# Patient Record
Sex: Female | Born: 1985 | Race: White | Hispanic: No | Marital: Married | Smoking: Never smoker
Health system: Southern US, Community
[De-identification: ages and names within clinical notes are randomized; demographics above are authoritative.]

## PROBLEM LIST (undated history)

## (undated) DIAGNOSIS — Z789 Other specified health status: Secondary | ICD-10-CM

## (undated) DIAGNOSIS — D649 Anemia, unspecified: Secondary | ICD-10-CM

## (undated) DIAGNOSIS — R87629 Unspecified abnormal cytological findings in specimens from vagina: Secondary | ICD-10-CM

## (undated) HISTORY — DX: Anemia, unspecified: D64.9

## (undated) HISTORY — PX: WISDOM TOOTH EXTRACTION: SHX21

## (undated) HISTORY — DX: Unspecified abnormal cytological findings in specimens from vagina: R87.629

---

## 2005-08-05 ENCOUNTER — Ambulatory Visit: Payer: Self-pay | Admitting: Internal Medicine

## 2008-04-09 LAB — CONVERTED CEMR LAB: Pap Smear: NORMAL

## 2009-01-12 ENCOUNTER — Ambulatory Visit: Payer: Self-pay | Admitting: Family Medicine

## 2009-01-12 ENCOUNTER — Ambulatory Visit: Payer: Self-pay | Admitting: Cardiology

## 2009-01-12 DIAGNOSIS — R1031 Right lower quadrant pain: Secondary | ICD-10-CM

## 2009-01-12 LAB — CONVERTED CEMR LAB
Basophils Absolute: 0 10*3/uL (ref 0.0–0.1)
Basophils Relative: 0.1 % (ref 0.0–3.0)
Blood in Urine, dipstick: NEGATIVE
Eosinophils Relative: 1 % (ref 0.0–5.0)
Glucose, Urine, Semiquant: NEGATIVE
HCT: 35.7 % — ABNORMAL LOW (ref 36.0–46.0)
Hemoglobin: 12.2 g/dL (ref 12.0–15.0)
Lymphocytes Relative: 42.8 % (ref 12.0–46.0)
Monocytes Relative: 7.4 % (ref 3.0–12.0)
Neutro Abs: 2.4 10*3/uL (ref 1.4–7.7)
Nitrite: NEGATIVE
RBC: 3.97 M/uL (ref 3.87–5.11)
WBC Urine, dipstick: NEGATIVE
WBC: 5.1 10*3/uL (ref 4.5–10.5)

## 2010-10-14 IMAGING — CT CT PELVIS W/ CM
2 of 4 series · 16 of 46 positions shown, 18 images · IV contrast (Omnipaque 300)
Comparison: None

CT ABDOMEN

CLINICAL DATA: Right lower quadrant abdominal pain and diarrhea.

CT ABDOMEN AND PELVIS WITH CONTRAST
TECHNIQUE: Multidetector CT imaging of the abdomen and pelvis was
performed using the standard protocol following bolus
administration of intravenous contrast.
Contrast: 100 ml Jmnipaque-B[facility] IV

[Series 2: abd/ pel · axial · 0.75mm/px · z∈[-509,-119]mm · 13 of 89 slices shown, 15 images]
[im 7/89  soft-tissue]
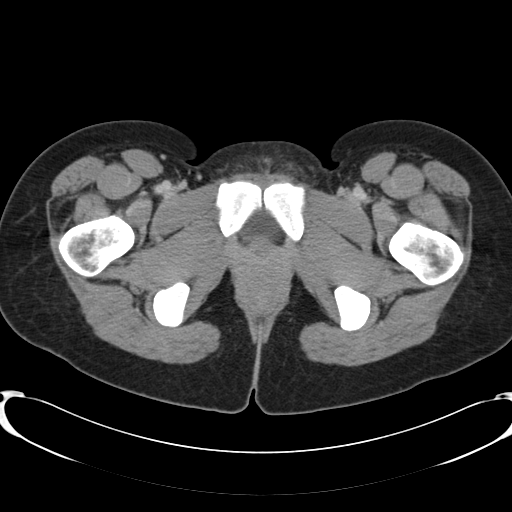
[im 7/89  bone]
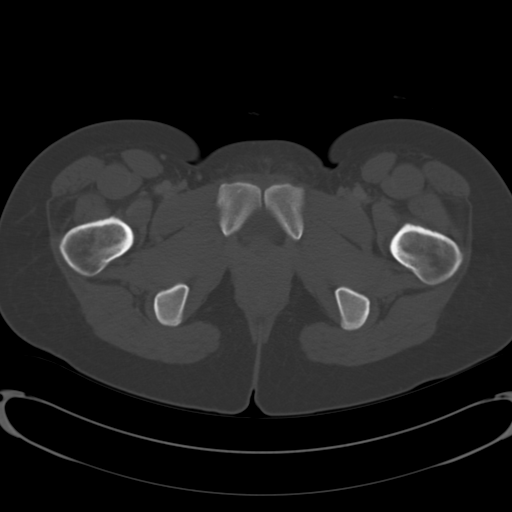
[im 14/89  soft-tissue]
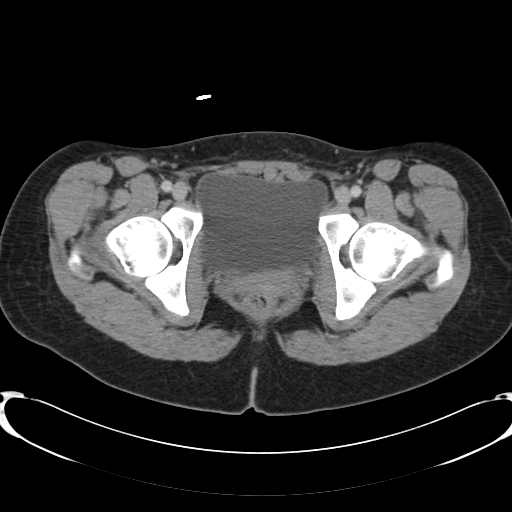
[im 20/89  soft-tissue]
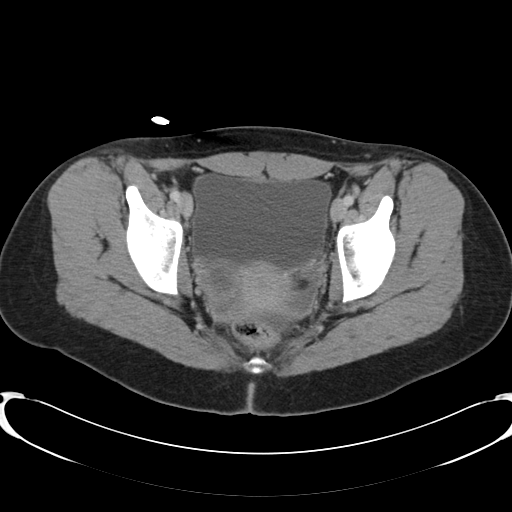
[im 27/89  soft-tissue]
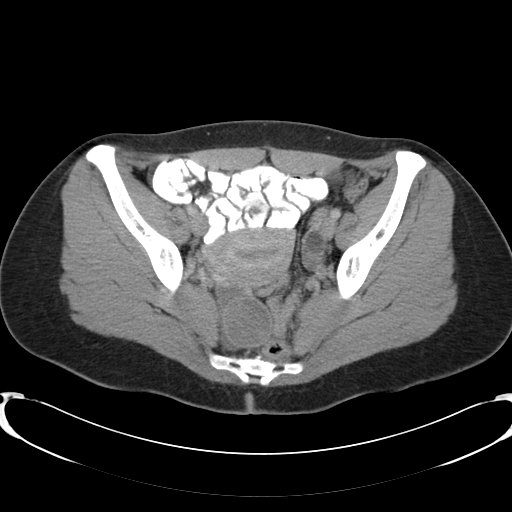
[im 33/89  soft-tissue]
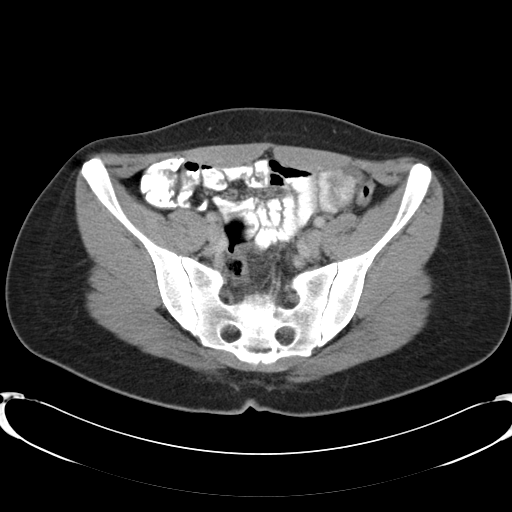
[im 40/89  soft-tissue]
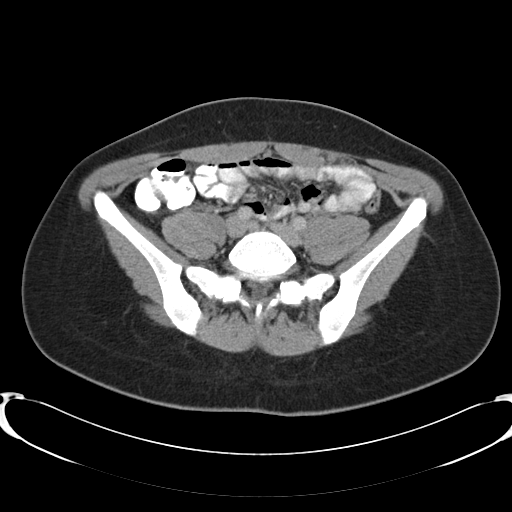
[im 46/89  soft-tissue]
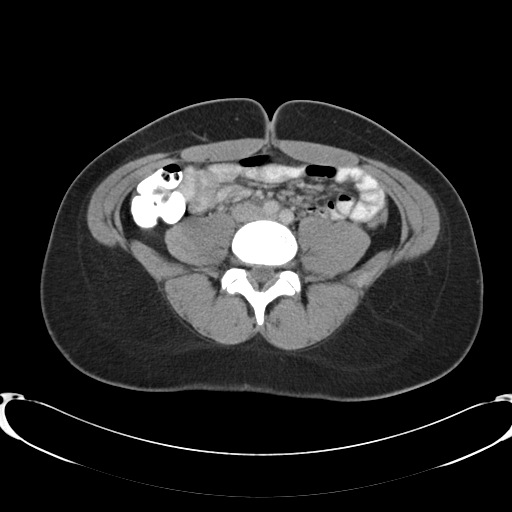
[im 53/89  soft-tissue]
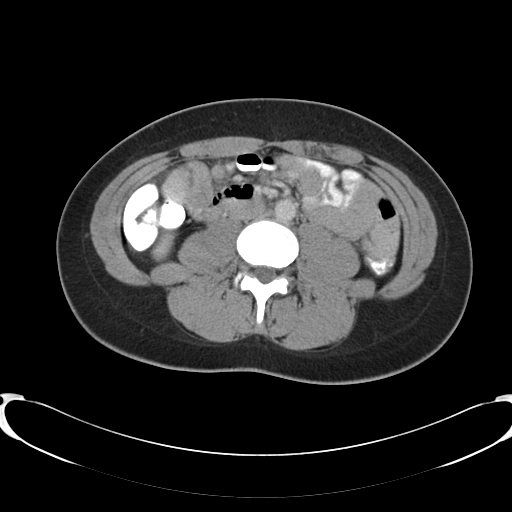
[im 59/89  soft-tissue]
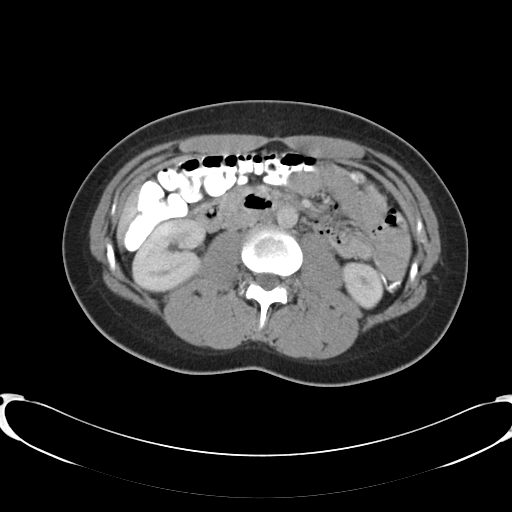
[im 59/89  bone]
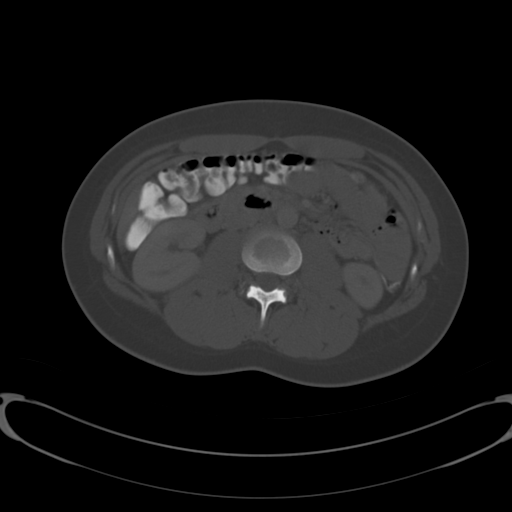
[im 66/89  soft-tissue]
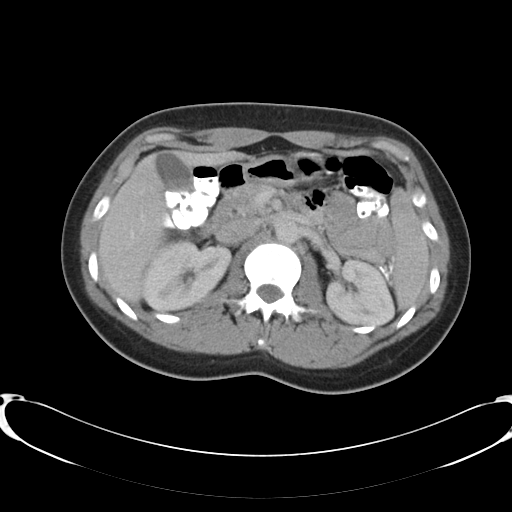
[im 72/89  soft-tissue]
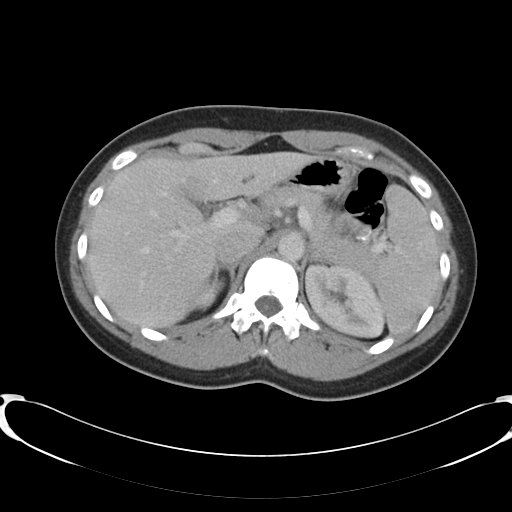
[im 79/89  soft-tissue]
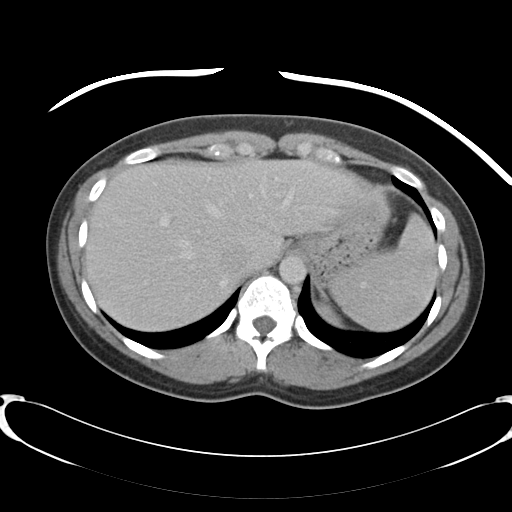
[im 85/89  soft-tissue]
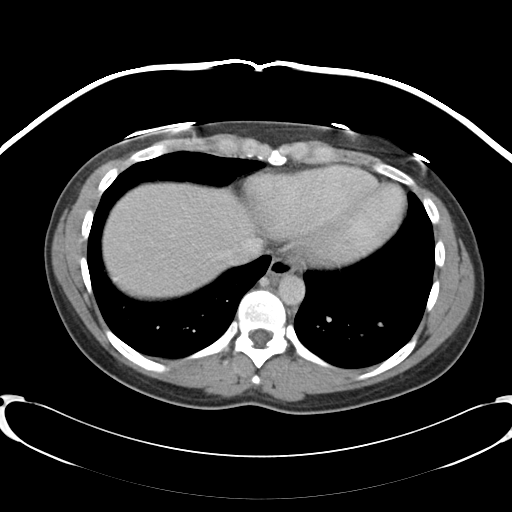

[Series 602: <mpr range> · coronal · 0.90mm/px · 3 of 102 slices shown]
[im 34/102  soft-tissue]
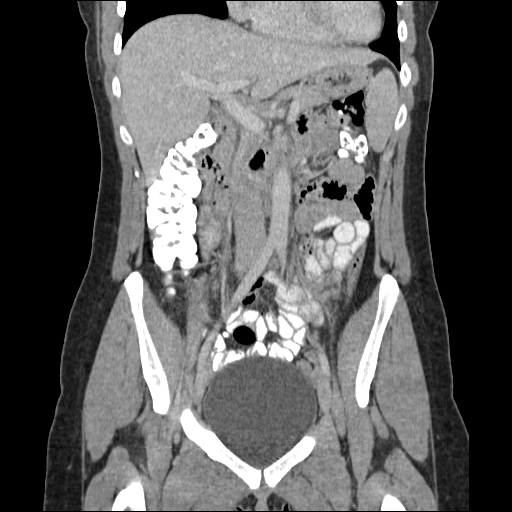
[im 45/102  soft-tissue]
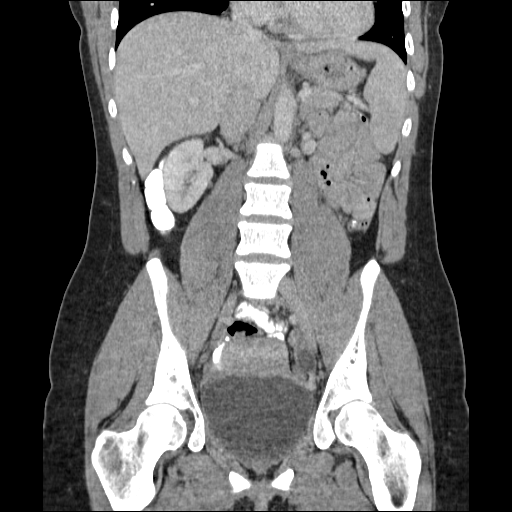
[im 57/102  soft-tissue]
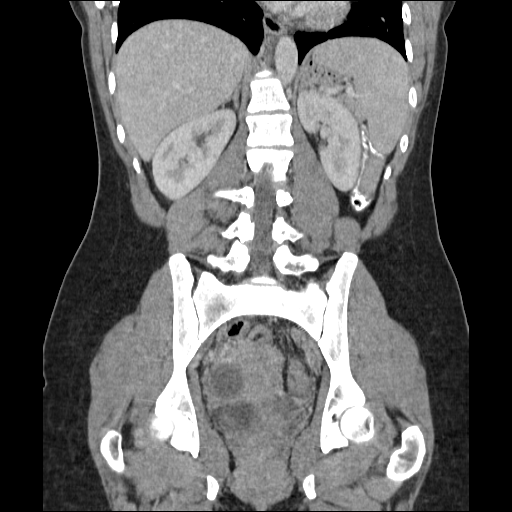

[16 of 46 positions shown; findings below may reference images not displayed]

FINDINGS: The liver, gallbladder, spleen, pancreas, adrenal glands
and kidneys are normal by CT.  There is no evidence of bowel
obstruction or acute inflammation inflammatory process in the
abdomen.  No focal fluid collections are identified.  No evidence
of enlarged lymph nodes or hernias.  No abnormal calcifications.
IMPRESSION: Normal CT of abdomen.

CT PELVIS
FINDINGS: There is adequate contrast material in the distal small
bowel and proximal colon.  Contrast is also visualized and a normal
caliber appendix.  There is no evidence of appendicitis by CT.

Abnormal cystic structures are seen in the right posterior pelvis
adjacent to, and posterior to, the uterus.  Complex cystic
abnormality measuring 3.5 x 3.8 cm demonstrates internal
hemorrhagic / complex fluid density.  Adjacent irregular cystic
area measures 2.3 cm and anterior adjacent more simple cystic area
measures 3.6 cm.  These are all likely emanating from the right
ovary / adnexa.  Given complex fluid density in the largest
abnormality, follow-up with pelvic ultrasound is recommended.
There is some associated free fluid in the cul-de-sac and posterior
pelvis potentially implicating recent cyst rupture as etiology of
the patient's pain.

Small follicles are present in the left ovary which have a normal
appearance.  A small amount of free fluid is also present
anteriorly in the pelvis on both right and left sides of the
midline.  The bladder is unremarkable.  No hernias are identified.
No abnormal calcifications.
IMPRESSION: Cystic structures of the right pelvis likely related to adnexal
cysts.  One of these measures 3.8 cm and demonstrates internal
increased density likely consistent with hemorrhagic / complex
fluid.  Irregular cyst is also present with adjacent free fluid
potentially indicating recent cyst rupture.  Given size of cystic
abnormalities, follow-up ultrasound is recommended.

## 2014-12-07 ENCOUNTER — Ambulatory Visit (INDEPENDENT_AMBULATORY_CARE_PROVIDER_SITE_OTHER): Payer: 59 | Admitting: Physician Assistant

## 2014-12-07 VITALS — BP 100/68 | HR 86 | Temp 98.3°F | Resp 16 | Ht 67.75 in | Wt 163.0 lb

## 2014-12-07 DIAGNOSIS — H6122 Impacted cerumen, left ear: Secondary | ICD-10-CM | POA: Diagnosis not present

## 2014-12-07 NOTE — Progress Notes (Signed)
[facility] [address] [place] [address] [phone]  Date:  12/07/2014   Name:  Kayla Trevino   DOB:  [birth year]   MRN:  [id number]  PCP:  No PCP Per Patient    Chief Complaint: Ear Fullness   History of Present Illness:  This is a 29 y.o. female who is presenting with ear fullness in left ear x 1 week. Feels her hearing is muffled. Noticed the ear fullness after going swimming 1 week ago. She denies drainage or pain. She has never needed ear cleaned out for cerumen build but states she has been told before she had a lot of wax in her left ear. She has tried putting mineral oil and alcohol in ear to break up wax but hasn't helped.  Review of Systems:  Review of Systems See HPI  Patient Active Problem List   Diagnosis Date Noted  . RLQ PAIN 01/12/2009    Prior to Admission medications   Medication Sig Start Date End Date Taking? Authorizing Provider  norethindrone-ethinyl estradiol (MICROGESTIN,JUNEL,LOESTRIN) 1-20 MG-MCG tablet Take 1 tablet by mouth daily.   Yes Historical Provider, MD    No Known Allergies  History reviewed. No pertinent past surgical history.  History  Substance Use Topics  . Smoking status: Never Smoker   . Smokeless tobacco: Not on file  . Alcohol Use: 0.0 oz/week    0 Standard drinks or equivalent per week    Family History  Problem Relation Age of Onset  . Diabetes Father     Medication list has been reviewed and updated.  Physical Examination:  Physical Exam  Constitutional: She is oriented to person, place, and time. She appears well-developed and well-nourished. No distress.  HENT:  Head: Normocephalic and atraumatic.  Right Ear: Hearing, tympanic membrane, external ear and ear canal normal.  Left Ear: Hearing, external ear and ear canal normal.  Nose: Nose normal.  Left TM blocked by cerumen impaction After lavage, cerumen removed and TM clear  Eyes: Conjunctivae and lids are normal. Right eye  exhibits no discharge. Left eye exhibits no discharge. No scleral icterus.  Pulmonary/Chest: Effort normal. No respiratory distress.  Musculoskeletal: Normal range of motion.  Neurological: She is alert and oriented to person, place, and time.  Skin: Skin is warm, dry and intact. No lesion and no rash noted.  Psychiatric: She has a normal mood and affect. Her speech is normal and behavior is normal. Thought content normal.   BP 100/68 mmHg  Pulse 86  Temp(Src) 98.3 F (36.8 C) (Oral)  Resp 16  Ht 5' 7.75" (1.721 m)  Wt 163 lb (73.936 kg)  BMI 24.96 kg/m2  SpO2 96%  LMP 11/23/2014  Assessment and Plan:  1. Cerumen impaction, left Cerumen removed, symptoms resolved. Discussed prevention. Return with further problems/concerns.   Benjaman Pott Drenda Freeze, MHS [facility]  12/07/2014

## 2015-07-04 MED FILL — LARIN 21 1-20 TABLET: 1-20 | 84 days supply | Qty: 63 | Fill #1

## 2015-08-28 DIAGNOSIS — H6502 Acute serous otitis media, left ear: Secondary | ICD-10-CM | POA: Diagnosis not present

## 2015-08-28 DIAGNOSIS — H8192 Unspecified disorder of vestibular function, left ear: Secondary | ICD-10-CM | POA: Diagnosis not present

## 2015-08-28 MED FILL — predniSONE 10 MG TABS: 10 | 5 days supply | Qty: 15 | Fill #0

## 2015-08-28 MED FILL — MECLIZINE 25 MG TABLET: 25 | 5 days supply | Qty: 15 | Fill #0

## 2015-08-31 DIAGNOSIS — R42 Dizziness and giddiness: Secondary | ICD-10-CM | POA: Diagnosis not present

## 2015-09-28 MED FILL — LARIN 21 1-20 TABLET: 1-20 | 84 days supply | Qty: 63 | Fill #2

## 2015-12-13 MED FILL — LARIN 21 1-20 TABLET: 1-20 | 84 days supply | Qty: 63 | Fill #3

## 2016-01-29 DIAGNOSIS — Z01419 Encounter for gynecological examination (general) (routine) without abnormal findings: Secondary | ICD-10-CM | POA: Diagnosis not present

## 2016-01-29 DIAGNOSIS — Z6826 Body mass index (BMI) 26.0-26.9, adult: Secondary | ICD-10-CM | POA: Diagnosis not present

## 2016-06-09 NOTE — L&D Delivery Note (Signed)
Delivery Note At 12:52 AM a viable female was delivered via Vaginal, Spontaneous (Presentation: DOA;  ).  APGAR: 2, 8; weight  .  pending Placenta status: , spont, intact.  Cord: 3vc with the following complications: tight double nuchal cord, clamped after delivery of head, shoulder followed easilty.  Cord pH: pending  Maternal temp to 101.3 immediately after delivery, no temp on baby. Will wtch x 1 hr, plan abx if mom still with temp. Baby with thick meconium and low 4min Apgar, neonatologist in to observe, suction, stimulate.   Anesthesia:  epidural Episiotomy: None Lacerations: 2nd degree;Sulcus Suture Repair: 2.0 3.0 vicryl rapide Est. Blood Loss (mL):  350  Mom to postpartum.  Baby to Couplet care / Skin to Skin.  Ala Dach 04/26/2017, 1:39 AM

## 2016-08-13 DIAGNOSIS — Z3201 Encounter for pregnancy test, result positive: Secondary | ICD-10-CM | POA: Diagnosis not present

## 2016-09-01 DIAGNOSIS — Z3201 Encounter for pregnancy test, result positive: Secondary | ICD-10-CM | POA: Diagnosis not present

## 2016-09-10 MED FILL — PRENATAL VITAMIN PLUS LOW I: 27-1 | 90 days supply | Qty: 90 | Fill #0

## 2016-10-07 DIAGNOSIS — Z3401 Encounter for supervision of normal first pregnancy, first trimester: Secondary | ICD-10-CM | POA: Diagnosis not present

## 2016-10-07 DIAGNOSIS — Z3689 Encounter for other specified antenatal screening: Secondary | ICD-10-CM | POA: Diagnosis not present

## 2016-10-07 DIAGNOSIS — Z3491 Encounter for supervision of normal pregnancy, unspecified, first trimester: Secondary | ICD-10-CM | POA: Diagnosis not present

## 2016-10-07 DIAGNOSIS — Z36 Encounter for antenatal screening for chromosomal anomalies: Secondary | ICD-10-CM | POA: Diagnosis not present

## 2016-10-20 DIAGNOSIS — Z3682 Encounter for antenatal screening for nuchal translucency: Secondary | ICD-10-CM | POA: Diagnosis not present

## 2016-11-12 DIAGNOSIS — Z3682 Encounter for antenatal screening for nuchal translucency: Secondary | ICD-10-CM | POA: Diagnosis not present

## 2016-11-12 DIAGNOSIS — Z361 Encounter for antenatal screening for raised alphafetoprotein level: Secondary | ICD-10-CM | POA: Diagnosis not present

## 2016-12-04 DIAGNOSIS — Z363 Encounter for antenatal screening for malformations: Secondary | ICD-10-CM | POA: Diagnosis not present

## 2016-12-04 DIAGNOSIS — Z3686 Encounter for antenatal screening for cervical length: Secondary | ICD-10-CM | POA: Diagnosis not present

## 2016-12-11 MED FILL — PRENATAL VITAMIN PLUS LOW I: 27-1 | 90 days supply | Qty: 90 | Fill #1

## 2017-01-28 DIAGNOSIS — Z3689 Encounter for other specified antenatal screening: Secondary | ICD-10-CM | POA: Diagnosis not present

## 2017-02-13 DIAGNOSIS — Z3689 Encounter for other specified antenatal screening: Secondary | ICD-10-CM | POA: Diagnosis not present

## 2017-02-13 DIAGNOSIS — Z23 Encounter for immunization: Secondary | ICD-10-CM | POA: Diagnosis not present

## 2017-03-04 MED FILL — hydrOXYzine HCL 25 MG TABS: 25 | 20 days supply | Qty: 20 | Fill #0

## 2017-03-16 MED FILL — PRENATAL VITAMIN PLUS LOW I: 27-1 | 90 days supply | Qty: 90 | Fill #2

## 2017-03-25 DIAGNOSIS — Z3685 Encounter for antenatal screening for Streptococcus B: Secondary | ICD-10-CM | POA: Diagnosis not present

## 2017-04-09 LAB — OB RESULTS CONSOLE GBS: GBS: NEGATIVE

## 2017-04-24 DIAGNOSIS — Z3A39 39 weeks gestation of pregnancy: Secondary | ICD-10-CM | POA: Diagnosis not present

## 2017-04-24 DIAGNOSIS — O3663X0 Maternal care for excessive fetal growth, third trimester, not applicable or unspecified: Secondary | ICD-10-CM | POA: Diagnosis not present

## 2017-04-25 ENCOUNTER — Encounter (HOSPITAL_COMMUNITY): Payer: Self-pay | Admitting: Anesthesiology

## 2017-04-25 ENCOUNTER — Inpatient Hospital Stay (HOSPITAL_COMMUNITY): Payer: 59 | Admitting: Anesthesiology

## 2017-04-25 ENCOUNTER — Encounter (HOSPITAL_COMMUNITY): Payer: Self-pay | Admitting: *Deleted

## 2017-04-25 ENCOUNTER — Inpatient Hospital Stay (HOSPITAL_COMMUNITY)
Admission: AD | Admit: 2017-04-25 | Discharge: 2017-04-27 | DRG: 805 | Disposition: A | Discharge status: Home / Self Care | Payer: 59 | Source: Ambulatory Visit | Attending: Obstetrics | Admitting: Obstetrics

## 2017-04-25 DIAGNOSIS — O411230 Chorioamnionitis, third trimester, not applicable or unspecified: Secondary | ICD-10-CM | POA: Diagnosis present

## 2017-04-25 DIAGNOSIS — Z3A39 39 weeks gestation of pregnancy: Secondary | ICD-10-CM | POA: Diagnosis not present

## 2017-04-25 DIAGNOSIS — O3663X0 Maternal care for excessive fetal growth, third trimester, not applicable or unspecified: Secondary | ICD-10-CM | POA: Diagnosis present

## 2017-04-25 DIAGNOSIS — Z3483 Encounter for supervision of other normal pregnancy, third trimester: Secondary | ICD-10-CM | POA: Diagnosis present

## 2017-04-25 HISTORY — DX: Other specified health status: Z78.9

## 2017-04-25 LAB — CBC
HEMATOCRIT: 38.5 % (ref 36.0–46.0)
HEMOGLOBIN: 13 g/dL (ref 12.0–15.0)
MCH: 32.2 pg (ref 26.0–34.0)
MCHC: 33.8 g/dL (ref 30.0–36.0)
MCV: 95.3 fL (ref 78.0–100.0)
Platelets: 173 10*3/uL (ref 150–400)
RBC: 4.04 MIL/uL (ref 3.87–5.11)
RDW: 12.9 % (ref 11.5–15.5)
WBC: 12 10*3/uL — ABNORMAL HIGH (ref 4.0–10.5)

## 2017-04-25 LAB — TYPE AND SCREEN
ABO/RH(D): O POS
Antibody Screen: NEGATIVE

## 2017-04-25 LAB — ABO/RH: ABO/RH(D): O POS

## 2017-04-25 MED ORDER — ACETAMINOPHEN 325 MG PO TABS: 650.0000 mg | ORAL_TABLET | ORAL | Status: DC | PRN

## 2017-04-25 MED ORDER — FENTANYL 2.5 MCG/ML BUPIVACAINE 1/10 % EPIDURAL INFUSION (WH - ANES)
14.0000 mL/h | INTRAMUSCULAR | Status: DC | PRN
  Administered 2017-04-25: 14 mL/h via EPIDURAL
  Filled 2017-04-25: qty 100

## 2017-04-25 MED ORDER — LIDOCAINE HCL (PF) 1 % IJ SOLN
30.0000 mL | INTRAMUSCULAR | Status: DC | PRN
  Filled 2017-04-25: qty 30

## 2017-04-25 MED ORDER — OXYCODONE-ACETAMINOPHEN 5-325 MG PO TABS: 1.0000 | ORAL_TABLET | ORAL | Status: DC | PRN

## 2017-04-25 MED ORDER — LACTATED RINGERS IV SOLN: 500.0000 mL | Freq: Once | INTRAVENOUS | Status: DC

## 2017-04-25 MED ORDER — OXYTOCIN 40 UNITS IN LACTATED RINGERS INFUSION - SIMPLE MED
1.0000 m[IU]/min | INTRAVENOUS | Status: DC
  Administered 2017-04-25: 2 m[IU]/min via INTRAVENOUS

## 2017-04-25 MED ORDER — LACTATED RINGERS IV SOLN
INTRAVENOUS | Status: DC
  Administered 2017-04-25 (×2): via INTRAVENOUS

## 2017-04-25 MED ORDER — FLEET ENEMA 7-19 GM/118ML RE ENEM: 1.0000 | ENEMA | RECTAL | Status: DC | PRN

## 2017-04-25 MED ORDER — PHENYLEPHRINE 40 MCG/ML (10ML) SYRINGE FOR IV PUSH (FOR BLOOD PRESSURE SUPPORT)
80.0000 ug | PREFILLED_SYRINGE | INTRAVENOUS | Status: DC | PRN
  Filled 2017-04-25: qty 5
  Filled 2017-04-25: qty 10

## 2017-04-25 MED ORDER — PHENYLEPHRINE 40 MCG/ML (10ML) SYRINGE FOR IV PUSH (FOR BLOOD PRESSURE SUPPORT)
80.0000 ug | PREFILLED_SYRINGE | INTRAVENOUS | Status: DC | PRN
  Filled 2017-04-25: qty 5

## 2017-04-25 MED ORDER — SOD CITRATE-CITRIC ACID 500-334 MG/5ML PO SOLN: 30.0000 mL | ORAL | Status: DC | PRN

## 2017-04-25 MED ORDER — OXYCODONE-ACETAMINOPHEN 5-325 MG PO TABS: 2.0000 | ORAL_TABLET | ORAL | Status: DC | PRN

## 2017-04-25 MED ORDER — LIDOCAINE HCL (PF) 1 % IJ SOLN
INTRAMUSCULAR | Status: DC | PRN
  Administered 2017-04-25: 4 mL

## 2017-04-25 MED ORDER — LACTATED RINGERS IV SOLN: 500.0000 mL | INTRAVENOUS | Status: DC | PRN

## 2017-04-25 MED ORDER — OXYTOCIN 40 UNITS IN LACTATED RINGERS INFUSION - SIMPLE MED
2.5000 [IU]/h | INTRAVENOUS | Status: DC
  Filled 2017-04-25: qty 1000

## 2017-04-25 MED ORDER — TERBUTALINE SULFATE 1 MG/ML IJ SOLN
0.2500 mg | Freq: Once | INTRAMUSCULAR | Status: DC | PRN
  Filled 2017-04-25: qty 1

## 2017-04-25 MED ORDER — EPHEDRINE 5 MG/ML INJ
10.0000 mg | INTRAVENOUS | Status: DC | PRN
  Filled 2017-04-25: qty 2

## 2017-04-25 MED ORDER — DIPHENHYDRAMINE HCL 50 MG/ML IJ SOLN: 12.5000 mg | INTRAMUSCULAR | Status: DC | PRN

## 2017-04-25 MED ORDER — ONDANSETRON HCL 4 MG/2ML IJ SOLN
4.0000 mg | Freq: Four times a day (QID) | INTRAMUSCULAR | Status: DC | PRN
  Administered 2017-04-25: 4 mg via INTRAVENOUS
  Filled 2017-04-25: qty 2

## 2017-04-25 MED ORDER — OXYTOCIN BOLUS FROM INFUSION
500.0000 mL | Freq: Once | INTRAVENOUS | Status: AC
  Administered 2017-04-26: 500 mL via INTRAVENOUS

## 2017-04-25 NOTE — Anesthesia Pain Management Evaluation Note (Signed)
  CRNA Pain Management Visit Note  Patient: Kayla Trevino, 31 y.o., female  "Hello I am a member of the anesthesia team at [facility]. We have an anesthesia team available at all times to provide care throughout the hospital, including epidural management and anesthesia for C-section. I don't know your plan for the delivery whether it a natural birth, water birth, IV sedation, nitrous supplementation, doula or epidural, but we want to meet your pain goals."   1.Was your pain managed to your expectations on prior hospitalizations?   No prior hospitalizations  2.What is your expectation for pain management during this hospitalization?     Labor support without medications and Epidural  3.How can we help you reach that goal? Possible epidural  Record the patient's initial score and the patient's pain goal.   Pain: 8  Pain Goal: 8 [facility] wants you to be able to say your pain was always managed very well.  Kieth Hartis 04/25/2017

## 2017-04-25 NOTE — Progress Notes (Signed)
S: Doing well, no complaints, pt has been doing well breathing through contractions, now wanting epidural.   O: BP 121/73   Pulse 91   Temp 99.2 F (37.3 C) (Oral)   Resp 20   Ht 5\' 7"  (1.702 m)   Wt 94.3 kg (208 lb)   BMI 32.58 kg/m    FHT:  FHR: 130s bpm, variability: moderate,  accelerations:  Present,  decelerations:  Absent UC:   regular, every 3 minutes SVE:   Dilation: 6.5 Effacement (%): 100 Station: 0 Exam by:: Dr. Pamala Hurry   A / P:  31 y.o.  Obstetric History   G1   P0   T0   P0   A0   L0    SAB0   TAB0   Ectopic0   Multiple0   Live Births0    at [redacted]w[redacted]d Protracted active phase  Epidural encourage so pt can rest IUPC now, may need pitocin.   Fetal Wellbeing:  Category I Pain Control:  move to epidural now  Anticipated MOD:  unclear, lack of descent and known LGA as risks for PCS  Ala Dach 04/25/2017, 9:37 PM

## 2017-04-25 NOTE — Anesthesia Preprocedure Evaluation (Signed)
Anesthesia Evaluation  Patient identified by MRN, date of birth, ID band Patient awake    Reviewed: Allergy & Precautions, Patient's Chart, lab work & pertinent test results  Airway Mallampati: II       Dental  (+) Teeth Intact   Pulmonary neg pulmonary ROS,    breath sounds clear to auscultation       Cardiovascular negative cardio ROS   Rhythm:Regular Rate:Normal     Neuro/Psych negative neurological ROS     GI/Hepatic negative GI ROS, Neg liver ROS,   Endo/Other  negative endocrine ROS  Renal/GU negative Renal ROS     Musculoskeletal negative musculoskeletal ROS (+)   Abdominal   Peds  Hematology negative hematology ROS (+)   Anesthesia Other Findings Day of surgery medications reviewed with the patient.  Reproductive/Obstetrics (+) Pregnancy                             Lab Results  Component Value Date   WBC 12.0 (H) 04/25/2017   HGB 13.0 04/25/2017   HCT 38.5 04/25/2017   MCV 95.3 04/25/2017   PLT 173 04/25/2017     Anesthesia Physical Anesthesia Plan  ASA: II  Anesthesia Plan: Epidural   Post-op Pain Management:    Induction:   PONV Risk Score and Plan:   Airway Management Planned:   Additional Equipment:   Intra-op Plan:   Post-operative Plan:   Informed Consent: I have reviewed the patients History and Physical, chart, labs and discussed the procedure including the risks, benefits and alternatives for the proposed anesthesia with the patient or authorized representative who has indicated his/her understanding and acceptance.     Plan Discussed with:   Anesthesia Plan Comments:         Anesthesia Secrist Evaluation

## 2017-04-25 NOTE — MAU Note (Signed)
Ctx since 2000- have progressively gotten stronger and more regular. Having bloody show, no LOF. +FM. No complications with preg.

## 2017-04-25 NOTE — H&P (Signed)
Kayla Trevino is a 31 y.o. G1P0 at [redacted]w[redacted]d presenting for active labor. Pt notes onset contractions yesterday at 8pm. Contracted through the night, poor sleep, presented to MAU around 3p, made small cervical change and admitted at 4cm.  Good fetal movement, No vaginal bleeding, not leaking fluid.  PNCare at [facility] since 6 wks - Dated by LMP c/w 6 wk  U/s - LGA. U./s at 39 wks- baby 4200grams, HC 34cm, AC 38cm, DS 81.   Prenatal Transfer Tool  Maternal Diabetes: No Genetic Screening: Normal Maternal Ultrasounds/Referrals: Normal Fetal Ultrasounds or other Referrals:  None Maternal Substance Abuse:  No Significant Maternal Medications:  None Significant Maternal Lab Results: None     OB History    Gravida Para Term Preterm AB Living   1             SAB TAB Ectopic Multiple Live Births                 Past Medical History:  Diagnosis Date  . Medical history non-contributory    Past Surgical History:  Procedure Laterality Date  . WISDOM TOOTH EXTRACTION     Family History: family history includes Diabetes in her father. Social History:  reports that  has never smoked. she has never used smokeless tobacco. She reports that she drinks alcohol. She reports that she does not use drugs.  Review of Systems - Negative except uncomfrtable contractions   Dilation: 4 Effacement (%): 100 Station: -2 Exam by:: n druebbisch rn Blood pressure 138/86, pulse 82, temperature 97.8 F (36.6 C), temperature source Oral, resp. rate 18, height 5\' 7"  (1.702 m), weight 94.3 kg (208 lb).  Physical Exam:  Gen: well appearing, no distress,. On birthing ball, uncomfortable with contractions  Abd: gravid, NT, no RUQ pain LE: trace edema, equal bilaterally, non-tender Toco: q5 ,min FH: baseline 130s, accelerations present, no deceleratons, 10 beat variability  Prenatal labs: ABO, Rh: --/--/O POS (11/17 1635) Antibody: NEG (11/17 1635) Rubella:  immune RPR:   NR HBsAg:  neg  HIV:    neg GBS: Negative (11/01 0000)  1 hr Glucola 81  Genetic screening nl NT, nl AFP Anatomy US nl   Assessment/Plan: 31 y.o. G1P0 at [redacted]w[redacted]d Active labor, pt asks for Augmentation. AROM now.  Planning epidural Reactive fetal testing LGA, watch descent closely.    Ala Dach 04/25/2017, 5:59 PM

## 2017-04-25 NOTE — Anesthesia Procedure Notes (Signed)
Epidural Patient location during procedure: OB Start time: 04/25/2017 9:47 PM End time: 04/25/2017 9:53 PM  Staffing Anesthesiologist: Effie Berkshire, MD Performed: anesthesiologist   Preanesthetic Checklist Completed: patient identified, site marked, surgical consent, pre-op evaluation, timeout performed, IV checked, risks and benefits discussed and monitors and equipment checked  Epidural Patient position: sitting Prep: ChloraPrep Patient monitoring: heart rate, continuous pulse ox and blood pressure Approach: midline Location: L3-L4 Injection technique: LOR saline  Needle:  Needle type: Tuohy  Needle gauge: 17 G Needle length: 9 cm Catheter type: closed end flexible Catheter size: 20 Guage Test dose: negative and 1.5% lidocaine  Assessment Events: blood not aspirated, injection not painful, no injection resistance and no paresthesia  Additional Notes LOR @ 5.5  Patient identified. Risks/Benefits/Options discussed with patient including but not limited to bleeding, infection, nerve damage, paralysis, failed block, incomplete pain control, headache, blood pressure changes, nausea, vomiting, reactions to medications, itching and postpartum back pain. Confirmed with bedside nurse the patient's most recent platelet count. Confirmed with patient that they are not currently taking any anticoagulation, have any bleeding history or any family history of bleeding disorders. Patient expressed understanding and wished to proceed. All questions were answered. Sterile technique was used throughout the entire procedure. Please see nursing notes for vital signs. Test dose was given through epidural catheter and negative prior to continuing to dose epidural or start infusion. Warning signs of high block given to the patient including shortness of breath, tingling/numbness in hands, complete motor block, or any concerning symptoms with instructions to call for help. Patient was given instructions  on fall risk and not to get out of bed. All questions and concerns addressed with instructions to call with any issues or inadequate analgesia.    Reason for block:procedure for pain

## 2017-04-26 LAB — RPR: RPR: NONREACTIVE

## 2017-04-26 MED ORDER — SIMETHICONE 80 MG PO CHEW: 80.0000 mg | CHEWABLE_TABLET | ORAL | Status: DC | PRN

## 2017-04-26 MED ORDER — COCONUT OIL OIL
1.0000 "application " | TOPICAL_OIL | Status: DC | PRN
  Administered 2017-04-26: 1 via TOPICAL
  Filled 2017-04-26: qty 120

## 2017-04-26 MED ORDER — IBUPROFEN 600 MG PO TABS
600.0000 mg | ORAL_TABLET | Freq: Four times a day (QID) | ORAL | Status: DC
  Administered 2017-04-26 – 2017-04-27 (×4): 600 mg via ORAL
  Filled 2017-04-26 (×4): qty 1

## 2017-04-26 MED ORDER — ACETAMINOPHEN 325 MG PO TABS: 650.0000 mg | ORAL_TABLET | ORAL | Status: DC | PRN

## 2017-04-26 MED ORDER — OXYCODONE HCL 5 MG PO TABS: 10.0000 mg | ORAL_TABLET | ORAL | Status: DC | PRN

## 2017-04-26 MED ORDER — GENTAMICIN SULFATE 40 MG/ML IJ SOLN: 7.0000 mg/kg | INTRAVENOUS | Status: DC

## 2017-04-26 MED ORDER — PRENATAL MULTIVITAMIN CH
1.0000 | ORAL_TABLET | Freq: Every day | ORAL | Status: DC
  Administered 2017-04-26: 1 via ORAL
  Filled 2017-04-26: qty 1

## 2017-04-26 MED ORDER — BENZOCAINE-MENTHOL 20-0.5 % EX AERO
1.0000 | INHALATION_SPRAY | CUTANEOUS | Status: DC | PRN
Start: 2017-04-26 — End: 2017-04-27
  Filled 2017-04-26: qty 56

## 2017-04-26 MED ORDER — OXYCODONE HCL 5 MG PO TABS: 5.0000 mg | ORAL_TABLET | ORAL | Status: DC | PRN

## 2017-04-26 MED ORDER — DIBUCAINE 1 % RE OINT: 1.0000 "application " | TOPICAL_OINTMENT | RECTAL | Status: DC | PRN

## 2017-04-26 MED ORDER — CLINDAMYCIN PHOSPHATE 900 MG/50ML IV SOLN
900.0000 mg | Freq: Three times a day (TID) | INTRAVENOUS | Status: DC
  Administered 2017-04-26: 900 mg via INTRAVENOUS
  Filled 2017-04-26 (×2): qty 50

## 2017-04-26 MED ORDER — ONDANSETRON HCL 4 MG/2ML IJ SOLN
4.0000 mg | INTRAMUSCULAR | Status: DC | PRN
Start: 2017-04-26 — End: 2017-04-27

## 2017-04-26 MED ORDER — TETANUS-DIPHTH-ACELL PERTUSSIS 5-2.5-18.5 LF-MCG/0.5 IM SUSP: 0.5000 mL | Freq: Once | INTRAMUSCULAR | Status: DC

## 2017-04-26 MED ORDER — ZOLPIDEM TARTRATE 5 MG PO TABS: 5.0000 mg | ORAL_TABLET | Freq: Every evening | ORAL | Status: DC | PRN

## 2017-04-26 MED ORDER — DIPHENHYDRAMINE HCL 25 MG PO CAPS: 25.0000 mg | ORAL_CAPSULE | Freq: Four times a day (QID) | ORAL | Status: DC | PRN

## 2017-04-26 MED ORDER — SENNOSIDES-DOCUSATE SODIUM 8.6-50 MG PO TABS
2.0000 | ORAL_TABLET | ORAL | Status: DC
  Administered 2017-04-27: 2 via ORAL
  Filled 2017-04-26: qty 2

## 2017-04-26 MED ORDER — GENTAMICIN SULFATE 40 MG/ML IJ SOLN
7.0000 mg/kg | INTRAVENOUS | Status: DC
  Administered 2017-04-26: 520 mg via INTRAVENOUS
  Filled 2017-04-26: qty 13

## 2017-04-26 MED ORDER — GENTAMICIN SULFATE 40 MG/ML IJ SOLN: 7.0000 mg/kg | Freq: Three times a day (TID) | INTRAVENOUS | Status: DC

## 2017-04-26 MED ORDER — ONDANSETRON HCL 4 MG PO TABS: 4.0000 mg | ORAL_TABLET | ORAL | Status: DC | PRN

## 2017-04-26 MED ORDER — WITCH HAZEL-GLYCERIN EX PADS: 1.0000 "application " | MEDICATED_PAD | CUTANEOUS | Status: DC | PRN

## 2017-04-26 NOTE — Lactation Note (Signed)
This note was copied from a baby's chart. Lactation Consultation Note  Patient Name: Kayla Trevino OHYWV'P Date: 04/26/2017 Reason for consult: Initial assessment;Primapara;1st time breastfeeding;Term Breastfeeding consultation services and support information given and reviewed.  This is mom's first baby and newborn is 2 hours old.  Mom reports baby is latching easily and he is feeding every hour.  Reviewed feeding cues and instructed to offer breast with cues.  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  Encouraged to call for assist prn.  Maternal Data Does the patient have breastfeeding experience prior to this delivery?: No  Feeding Feeding Type: Breast Fed  LATCH Score Latch: Grasps breast easily, tongue down, lips flanged, rhythmical sucking.  Audible Swallowing: A few with stimulation  Type of Nipple: Everted at rest and after stimulation  Comfort (Breast/Nipple): Soft / non-tender  Hold (Positioning): No assistance needed to correctly position infant at breast.  LATCH Score: 9  Interventions    Lactation Tools Discussed/Used     Consult Status Consult Status: Follow-up Date: 04/27/17 Follow-up type: In-patient    Ave Filter 04/26/2017, 11:10 AM

## 2017-04-26 NOTE — Anesthesia Postprocedure Evaluation (Signed)
Anesthesia Post Note  Patient: Kayla Trevino  Procedure(s) Performed: AN AD HOC LABOR EPIDURAL     Patient location during evaluation: Mother Baby Anesthesia Type: Epidural Level of consciousness: awake and alert and oriented Pain management: satisfactory to patient Vital Signs Assessment: post-procedure vital signs reviewed and stable Respiratory status: spontaneous breathing and nonlabored ventilation Cardiovascular status: stable Postop Assessment: no headache, no backache, no signs of nausea or vomiting, adequate PO intake and patient able to bend at knees (patient up walking) Anesthetic complications: no    Last Vitals:  Vitals:   04/26/17 0900 04/26/17 1142  BP: (!) 101/52   Pulse: 66   Resp: 16   Temp: 36.7 C 36.7 C  SpO2:      Last Pain:  Vitals:   04/26/17 1142  TempSrc: Oral  PainSc:    Pain Goal:                 Willa Rough

## 2017-04-26 NOTE — Progress Notes (Signed)
PPD#1  Pt up and voiding, no fevers, minimal lochia, 'sore' in perineum. Baby doing well.  IV infiltrated and removed.  O: Vitals:   04/26/17 0450 04/26/17 0645 04/26/17 0900 04/26/17 1142  BP: (!) 100/52  (!) 101/52   Pulse: 88  66   Resp: 16  16   Temp: 100 F (37.8 C) 98.3 F (36.8 C) 98 F (36.7 C) 98 F (36.7 C)  TempSrc: Oral  Oral Oral  SpO2:      Weight:      Height:       Gen: well appearing, no distress Abd: fundus firm, NT, at umbilicus LE: trace edema, NT  A/P: PPD #0 s/p SVD, chorioamnionitis, doing well  Pt s/p 1, 24 hr gent dose and 1, 900mg  IV Clinda dose, given  IV out, no fevers, no fundal tenderness, will defer additional Abx, increase temp frequency to q 2-3 hrs though the day and treat with new fevers. Pt agrees, nurse aware of plan.  Ala Dach 04/26/2017 11:46 AM

## 2017-04-27 LAB — BIRTH TISSUE RECOVERY COLLECTION (PLACENTA DONATION)

## 2017-04-27 MED ORDER — IBUPROFEN 600 MG PO TABS: 600.0000 mg | ORAL_TABLET | Freq: Four times a day (QID) | ORAL | 0 refills | Status: DC

## 2017-04-27 NOTE — Progress Notes (Signed)
PPD 1 SVD with 2nd degree and sulcus repair   S:  Reports feeling well - wants to go home             Tolerating po/ No nausea or vomiting             Bleeding is light             Pain controlled with motrin             Up ad lib / ambulatory / voiding QS  Newborn breast feeding  / Circumcision done  O: S: BP (!) 92/48 (BP Location: Left Arm)   Pulse (!) 50   Temp 97.9 F (36.6 C) (Oral)   Resp 16   Ht 5\' 7"  (1.702 m)   Wt 94.3 kg (208 lb)   SpO2 97%   BMI 32.58 kg/m          T-max 101.8 at 0110 11/18 / now >24hrs afebrile   LABS:              Blood type: --/--/O POS, O POS (11/17 1635)  Rubella:     Immune             Flu and tdap current                     Physical Exam:             Alert and oriented X3  Abdomen: soft, non-tender, non-distended              Fundus: firm, non-tender, Ueven  Perineum: no edema  Lochia: scant  Extremities: no edema, no calf pain or tenderness    A: PPD # 1 SVD with 2nd degree and sulcus repair             Presumed chorioamnionitis - resolving / afebrile off antibiotics   Doing well - stable status  P: Routine post partum orders  DC home - WOB booklet - instructions reviewed  Artelia Laroche CNM, MSN, [facility] 04/27/2017, 8:38 AM

## 2017-04-27 NOTE — Discharge Summary (Signed)
Obstetric Discharge Summary Reason for Admission: onset of labor Prenatal Procedures: none Intrapartum Procedures: spontaneous vaginal delivery and epidural Postpartum Procedures: none Complications-Operative and Postpartum: 2nd and sulcus degree perineal laceration Hemoglobin  Date Value Ref Range Status  04/25/2017 13.0 12.0 - 15.0 g/dL Final   HCT  Date Value Ref Range Status  04/25/2017 38.5 36.0 - 46.0 % Final    Physical Exam:  General: alert, cooperative and no distress Lochia: scant Uterine Fundus: firm Incision: healing well DVT Evaluation: No evidence of DVT seen on physical exam.  Discharge Diagnoses: Term Pregnancy-delivered  Discharge Information: Date: 04/27/2017 Activity: pelvic rest Diet: routine Medications: PNV and Ibuprofen Condition: stable Instructions: refer to practice specific booklet Discharge to: home Follow-up Information    Aloha Gell, MD. Schedule an appointment as soon as possible for a visit in 6 week(s).   Specialty:  Obstetrics and Gynecology Contact information: [address] [place] [address] [phone]           Newborn Data: Live born female  Birth Weight: 7 lb 15.3 oz (3609 g) APGAR: 2, 8  Newborn Delivery   Birth date/time:  04/26/2017 00:52:00 Delivery type:  Vaginal, Spontaneous     Home with mother.  Kayla Trevino 04/27/2017, 9:06 AM

## 2017-04-27 NOTE — Lactation Note (Signed)
This note was copied from a baby's chart. Lactation Consultation Note  Patient Name: Kayla Trevino JSEGB'T Date: 04/27/2017  Randel Books continues to feed well.  Mom denies questions or concerns. Lactation outpatient services reviewed and encouraged prn.   Maternal Data    Feeding    LATCH Score                   Interventions    Lactation Tools Discussed/Used     Consult Status      Ave Filter 04/27/2017, 10:32 AM

## 2017-04-29 ENCOUNTER — Telehealth (HOSPITAL_COMMUNITY): Payer: Self-pay | Admitting: Lactation Services

## 2017-04-29 NOTE — Telephone Encounter (Addendum)
Milk coming-in phase, infant on day 4 of life.  Mom stated "hardened" areas on breasts and concerned she is "engorged." [place] educated that this is a normal milk volume increasing Lactogenesis II. Encouraged mom to feed on 1st side for as long as infant will stay making sure breast tissue is softened after feeding and then offering second side.  Told mom that if infant will not feed from second side or does not feed long enough for some relief, then to use her hand pump or hand express some milk off for relief, but not to pump breast completely down.   Reviewed supply and demand.   Encouraged mom to continue taking NSAIDs over next few days and told mom this phase will only last a few days.  Tomorrow should be better. Educated on S&S of pathological engorgement / mastitis and encouraged to seek medical attention if those S&S appear but regardless to continue breastfeeding infant. Mom felt better at end of conversation about her normal symptoms of lactogenesis II. Encouraged to call for further questions.

## 2017-06-08 MED FILL — NORETHINDRONE 0.35 MG TAB: 0.35 | 84 days supply | Qty: 84 | Fill #0

## 2017-07-21 ENCOUNTER — Encounter (HOSPITAL_COMMUNITY): Payer: Self-pay

## 2017-11-18 DIAGNOSIS — Z1151 Encounter for screening for human papillomavirus (HPV): Secondary | ICD-10-CM | POA: Diagnosis not present

## 2017-11-18 DIAGNOSIS — Z6826 Body mass index (BMI) 26.0-26.9, adult: Secondary | ICD-10-CM | POA: Diagnosis not present

## 2017-11-18 DIAGNOSIS — Z01419 Encounter for gynecological examination (general) (routine) without abnormal findings: Secondary | ICD-10-CM | POA: Diagnosis not present

## 2018-07-01 DIAGNOSIS — Z3201 Encounter for pregnancy test, result positive: Secondary | ICD-10-CM | POA: Diagnosis not present

## 2018-07-05 MED FILL — PRENATAL VITAMIN PLUS LOW I: 27-1 | 90 days supply | Qty: 90 | Fill #0

## 2018-07-25 DIAGNOSIS — J069 Acute upper respiratory infection, unspecified: Secondary | ICD-10-CM | POA: Diagnosis not present

## 2018-08-05 DIAGNOSIS — Z3201 Encounter for pregnancy test, result positive: Secondary | ICD-10-CM | POA: Diagnosis not present

## 2018-08-20 DIAGNOSIS — Z3689 Encounter for other specified antenatal screening: Secondary | ICD-10-CM | POA: Diagnosis not present

## 2018-08-20 DIAGNOSIS — O09521 Supervision of elderly multigravida, first trimester: Secondary | ICD-10-CM | POA: Diagnosis not present

## 2018-08-20 DIAGNOSIS — Z118 Encounter for screening for other infectious and parasitic diseases: Secondary | ICD-10-CM | POA: Diagnosis not present

## 2018-08-20 LAB — OB RESULTS CONSOLE GC/CHLAMYDIA
Chlamydia: NEGATIVE
Gonorrhea: NEGATIVE

## 2018-08-20 LAB — OB RESULTS CONSOLE GBS: GBS: POSITIVE

## 2018-08-20 LAB — OB RESULTS CONSOLE ANTIBODY SCREEN: Antibody Screen: NEGATIVE

## 2018-08-20 LAB — OB RESULTS CONSOLE HEPATITIS B SURFACE ANTIGEN: Hepatitis B Surface Ag: NEGATIVE

## 2018-08-20 LAB — OB RESULTS CONSOLE RUBELLA ANTIBODY, IGM: Rubella: IMMUNE

## 2018-08-20 LAB — OB RESULTS CONSOLE HIV ANTIBODY (ROUTINE TESTING): HIV: NONREACTIVE

## 2018-08-20 LAB — OB RESULTS CONSOLE ABO/RH: RH Type: POSITIVE

## 2018-08-20 LAB — OB RESULTS CONSOLE RPR: RPR: NONREACTIVE

## 2018-08-23 MED FILL — CEPHALEXIN 500 MG CAPSULE: 500 | 7 days supply | Qty: 14 | Fill #0

## 2018-09-14 MED FILL — PRENATAL VITAMIN PLUS LOW I: 27-1 | 90 days supply | Qty: 90 | Fill #0

## 2018-10-15 DIAGNOSIS — Z362 Encounter for other antenatal screening follow-up: Secondary | ICD-10-CM | POA: Diagnosis not present

## 2018-10-15 DIAGNOSIS — Z361 Encounter for antenatal screening for raised alphafetoprotein level: Secondary | ICD-10-CM | POA: Diagnosis not present

## 2018-11-12 DIAGNOSIS — Z3A23 23 weeks gestation of pregnancy: Secondary | ICD-10-CM | POA: Diagnosis not present

## 2018-11-12 DIAGNOSIS — Z362 Encounter for other antenatal screening follow-up: Secondary | ICD-10-CM | POA: Diagnosis not present

## 2018-11-12 DIAGNOSIS — O99820 Streptococcus B carrier state complicating pregnancy: Secondary | ICD-10-CM | POA: Diagnosis not present

## 2018-12-07 DIAGNOSIS — Z3689 Encounter for other specified antenatal screening: Secondary | ICD-10-CM | POA: Diagnosis not present

## 2018-12-07 MED FILL — PRENATAL VITAMIN PLUS LOW I: 27-1 | 90 days supply | Qty: 90 | Fill #1

## 2018-12-14 MED FILL — FERRALET 90 DUAL-IRON TAB: 90-1 | 30 days supply | Qty: 30 | Fill #0

## 2018-12-30 DIAGNOSIS — Z3689 Encounter for other specified antenatal screening: Secondary | ICD-10-CM | POA: Diagnosis not present

## 2018-12-30 DIAGNOSIS — Z23 Encounter for immunization: Secondary | ICD-10-CM | POA: Diagnosis not present

## 2019-01-14 MED FILL — FERRALET 90 DUAL-IRON TAB: 90-1 | 30 days supply | Qty: 30 | Fill #1

## 2019-02-10 DIAGNOSIS — Z3A36 36 weeks gestation of pregnancy: Secondary | ICD-10-CM | POA: Diagnosis not present

## 2019-02-10 DIAGNOSIS — Z3685 Encounter for antenatal screening for Streptococcus B: Secondary | ICD-10-CM | POA: Diagnosis not present

## 2019-02-10 DIAGNOSIS — O99013 Anemia complicating pregnancy, third trimester: Secondary | ICD-10-CM | POA: Diagnosis not present

## 2019-03-03 DIAGNOSIS — Z23 Encounter for immunization: Secondary | ICD-10-CM | POA: Diagnosis not present

## 2019-03-07 ENCOUNTER — Telehealth (HOSPITAL_COMMUNITY): Payer: Self-pay | Admitting: *Deleted

## 2019-03-07 ENCOUNTER — Encounter (HOSPITAL_COMMUNITY): Payer: Self-pay | Admitting: *Deleted

## 2019-03-07 NOTE — Telephone Encounter (Signed)
Preadmission screen  

## 2019-03-08 ENCOUNTER — Encounter (HOSPITAL_COMMUNITY): Payer: Self-pay | Admitting: *Deleted

## 2019-03-08 ENCOUNTER — Telehealth (HOSPITAL_COMMUNITY): Payer: Self-pay | Admitting: *Deleted

## 2019-03-08 NOTE — Telephone Encounter (Signed)
Preadmission screen  

## 2019-03-09 MED FILL — PRENATAL VITAMIN PLUS LOW I: 27-1 | 90 days supply | Qty: 90 | Fill #2

## 2019-03-11 ENCOUNTER — Other Ambulatory Visit: Payer: Self-pay | Admitting: Obstetrics

## 2019-03-11 DIAGNOSIS — O99013 Anemia complicating pregnancy, third trimester: Secondary | ICD-10-CM | POA: Diagnosis not present

## 2019-03-11 DIAGNOSIS — Z3A40 40 weeks gestation of pregnancy: Secondary | ICD-10-CM | POA: Diagnosis not present

## 2019-03-13 ENCOUNTER — Other Ambulatory Visit (HOSPITAL_COMMUNITY)
Admission: RE | Admit: 2019-03-13 | Discharge: 2019-03-13 | Disposition: A | Discharge status: Home / Self Care | Payer: 59 | Source: Ambulatory Visit | Attending: Obstetrics | Admitting: Obstetrics

## 2019-03-13 ENCOUNTER — Other Ambulatory Visit: Payer: Self-pay

## 2019-03-13 DIAGNOSIS — O99824 Streptococcus B carrier state complicating childbirth: Secondary | ICD-10-CM | POA: Diagnosis not present

## 2019-03-13 DIAGNOSIS — O9081 Anemia of the puerperium: Secondary | ICD-10-CM | POA: Diagnosis not present

## 2019-03-13 DIAGNOSIS — Z302 Encounter for sterilization: Secondary | ICD-10-CM | POA: Diagnosis not present

## 2019-03-13 DIAGNOSIS — O3483 Maternal care for other abnormalities of pelvic organs, third trimester: Secondary | ICD-10-CM | POA: Diagnosis not present

## 2019-03-13 DIAGNOSIS — O3663X0 Maternal care for excessive fetal growth, third trimester, not applicable or unspecified: Secondary | ICD-10-CM | POA: Diagnosis not present

## 2019-03-13 DIAGNOSIS — D62 Acute posthemorrhagic anemia: Secondary | ICD-10-CM | POA: Diagnosis not present

## 2019-03-13 DIAGNOSIS — N83202 Unspecified ovarian cyst, left side: Secondary | ICD-10-CM | POA: Diagnosis not present

## 2019-03-13 DIAGNOSIS — Z20828 Contact with and (suspected) exposure to other viral communicable diseases: Secondary | ICD-10-CM | POA: Diagnosis not present

## 2019-03-13 LAB — SARS CORONAVIRUS 2 (TAT 6-24 HRS): SARS Coronavirus 2: NEGATIVE

## 2019-03-13 NOTE — MAU Note (Signed)
Pt here for PAT covid swab, denies symptoms. Swab collected.

## 2019-03-14 ENCOUNTER — Other Ambulatory Visit: Payer: Self-pay | Admitting: Obstetrics

## 2019-03-14 NOTE — H&P (Signed)
Kayla Trevino is a 34 y.o. G2P1 at [redacted]w[redacted]d presenting for IOL, past EDC. Pt notes rare contractions. Good fetal movement, No vaginal bleeding, not leaking fluid.  [facility] at [facility] since 9 wks - LGA, u/s at 40 wks, baby 9'9/ 99%, 4334grams (pt proven to 8#) - GBS pos, bacteruria at the start of pregnancy - anemia, on iron - AMA, nl Panorama   Prenatal Transfer Tool  Maternal Diabetes: No Genetic Screening: Normal Maternal Ultrasounds/Referrals: Normal Fetal Ultrasounds or other Referrals:  None Maternal Substance Abuse:  No Significant Maternal Medications:  None Significant Maternal Lab Results: Group B Strep positive     OB History    Gravida  2   Para  1   Term      Preterm      AB      Living        SAB      TAB      Ectopic      Multiple      Live Births             Past Medical History:  Diagnosis Date  . Anemia   . Medical history non-contributory   . Vaginal Pap smear, abnormal    Past Surgical History:  Procedure Laterality Date  . WISDOM TOOTH EXTRACTION     Family History: family history includes Diabetes in her father; Hypertension in her father. Social History:  reports that she has never smoked. She has never used smokeless tobacco. She reports current alcohol use. She reports that she does not use drugs.  Review of Systems - Negative except discomfort of pregnancy   Physical Exam:  Vitals:   03/15/19 1557 03/15/19 1632  BP: 113/69 111/69  Pulse: 80 65  Resp: 16 16  Temp: 98 F (36.7 C)      Toco: irreg FH: baseline 140s, accelerations present, no deceleratons, 10 beat variability  Prenatal labs: ABO, Rh: O/Positive/-- (03/13 0000) Antibody: Negative (03/13 0000) Rubella: Immune (03/13 0000) RPR: Nonreactive (03/13 0000)  HBsAg: Negative (03/13 0000)  HIV: Non-reactive (03/13 0000)  GBS: Positive/-- (03/13 0000)  1 hr Glucola 97  Genetic screening normal Panorama, nl AFP Anatomy US normal   Assessment/Plan:  33 y.o. G2P1 at [redacted]w[redacted]d - term pregnancy, past EDC. Plan IOL wit pitocin 2x2 - GBS pos. PCN - LGA, watch labor curve   Ala Dach 03/14/2019, 10:05 PM  Ala Dach 03/15/2019 4:59 PM

## 2019-03-15 ENCOUNTER — Other Ambulatory Visit: Payer: Self-pay

## 2019-03-15 ENCOUNTER — Inpatient Hospital Stay (HOSPITAL_COMMUNITY): Payer: 59 | Admitting: Anesthesiology

## 2019-03-15 ENCOUNTER — Inpatient Hospital Stay (HOSPITAL_COMMUNITY): Payer: 59

## 2019-03-15 ENCOUNTER — Encounter (HOSPITAL_COMMUNITY): Payer: Self-pay | Admitting: *Deleted

## 2019-03-15 ENCOUNTER — Inpatient Hospital Stay (HOSPITAL_COMMUNITY)
Admission: AD | Admit: 2019-03-15 | Discharge: 2019-03-18 | DRG: 784 | Disposition: A | Discharge status: Home / Self Care | Payer: 59 | Attending: Obstetrics | Admitting: Obstetrics

## 2019-03-15 DIAGNOSIS — Z3490 Encounter for supervision of normal pregnancy, unspecified, unspecified trimester: Secondary | ICD-10-CM | POA: Diagnosis present

## 2019-03-15 DIAGNOSIS — Z412 Encounter for routine and ritual male circumcision: Secondary | ICD-10-CM | POA: Diagnosis not present

## 2019-03-15 DIAGNOSIS — O99824 Streptococcus B carrier state complicating childbirth: Secondary | ICD-10-CM | POA: Diagnosis present

## 2019-03-15 DIAGNOSIS — O9081 Anemia of the puerperium: Secondary | ICD-10-CM | POA: Diagnosis not present

## 2019-03-15 DIAGNOSIS — O3663X0 Maternal care for excessive fetal growth, third trimester, not applicable or unspecified: Secondary | ICD-10-CM | POA: Diagnosis present

## 2019-03-15 DIAGNOSIS — N83202 Unspecified ovarian cyst, left side: Secondary | ICD-10-CM | POA: Diagnosis not present

## 2019-03-15 DIAGNOSIS — Z302 Encounter for sterilization: Secondary | ICD-10-CM

## 2019-03-15 DIAGNOSIS — O3483 Maternal care for other abnormalities of pelvic organs, third trimester: Secondary | ICD-10-CM | POA: Diagnosis present

## 2019-03-15 DIAGNOSIS — Z3A40 40 weeks gestation of pregnancy: Secondary | ICD-10-CM | POA: Diagnosis not present

## 2019-03-15 DIAGNOSIS — Z20828 Contact with and (suspected) exposure to other viral communicable diseases: Secondary | ICD-10-CM | POA: Diagnosis present

## 2019-03-15 DIAGNOSIS — D271 Benign neoplasm of left ovary: Secondary | ICD-10-CM | POA: Diagnosis not present

## 2019-03-15 DIAGNOSIS — Z3A00 Weeks of gestation of pregnancy not specified: Secondary | ICD-10-CM | POA: Diagnosis not present

## 2019-03-15 DIAGNOSIS — D62 Acute posthemorrhagic anemia: Secondary | ICD-10-CM | POA: Diagnosis not present

## 2019-03-15 LAB — CBC
HCT: 36.4 % (ref 36.0–46.0)
Hemoglobin: 12.4 g/dL (ref 12.0–15.0)
MCH: 32.5 pg (ref 26.0–34.0)
MCHC: 34.1 g/dL (ref 30.0–36.0)
MCV: 95.5 fL (ref 80.0–100.0)
Platelets: 188 10*3/uL (ref 150–400)
RBC: 3.81 MIL/uL — ABNORMAL LOW (ref 3.87–5.11)
RDW: 12.5 % (ref 11.5–15.5)
WBC: 9.5 10*3/uL (ref 4.0–10.5)
nRBC: 0 % (ref 0.0–0.2)

## 2019-03-15 LAB — TYPE AND SCREEN
ABO/RH(D): O POS
Antibody Screen: NEGATIVE

## 2019-03-15 LAB — ABO/RH: ABO/RH(D): O POS

## 2019-03-15 MED ORDER — LACTATED RINGERS IV SOLN
500.0000 mL | INTRAVENOUS | Status: DC | PRN
  Administered 2019-03-16 (×2): 500 mL via INTRAVENOUS

## 2019-03-15 MED ORDER — LACTATED RINGERS IV SOLN
500.0000 mL | Freq: Once | INTRAVENOUS | Status: AC
  Administered 2019-03-15: 21:00:00 500 mL via INTRAVENOUS

## 2019-03-15 MED ORDER — EPHEDRINE 5 MG/ML INJ: 10.0000 mg | INTRAVENOUS | Status: DC | PRN

## 2019-03-15 MED ORDER — SODIUM CHLORIDE 0.9 % IV SOLN
5.0000 10*6.[IU] | Freq: Once | INTRAVENOUS | Status: AC
  Administered 2019-03-15: 16:00:00 5 10*6.[IU] via INTRAVENOUS
  Filled 2019-03-15: qty 5

## 2019-03-15 MED ORDER — LACTATED RINGERS IV SOLN
INTRAVENOUS | Status: DC
  Administered 2019-03-15 (×2): via INTRAVENOUS

## 2019-03-15 MED ORDER — PENICILLIN G 3 MILLION UNITS IVPB - SIMPLE MED
3.0000 10*6.[IU] | INTRAVENOUS | Status: DC
  Administered 2019-03-15 – 2019-03-16 (×2): 3 10*6.[IU] via INTRAVENOUS
  Filled 2019-03-15 (×2): qty 100

## 2019-03-15 MED ORDER — ONDANSETRON HCL 4 MG/2ML IJ SOLN: 4.0000 mg | Freq: Four times a day (QID) | INTRAMUSCULAR | Status: DC | PRN

## 2019-03-15 MED ORDER — ACETAMINOPHEN 325 MG PO TABS: 650.0000 mg | ORAL_TABLET | ORAL | Status: DC | PRN

## 2019-03-15 MED ORDER — SODIUM CHLORIDE (PF) 0.9 % IJ SOLN
INTRAMUSCULAR | Status: DC | PRN
  Administered 2019-03-15: 12 mL/h via EPIDURAL

## 2019-03-15 MED ORDER — FENTANYL-BUPIVACAINE-NACL 0.5-0.125-0.9 MG/250ML-% EP SOLN
12.0000 mL/h | EPIDURAL | Status: DC | PRN
  Filled 2019-03-15: qty 250

## 2019-03-15 MED ORDER — PHENYLEPHRINE 40 MCG/ML (10ML) SYRINGE FOR IV PUSH (FOR BLOOD PRESSURE SUPPORT)
80.0000 ug | PREFILLED_SYRINGE | INTRAVENOUS | Status: DC | PRN
  Administered 2019-03-15 (×2): 80 ug via INTRAVENOUS
  Filled 2019-03-15: qty 10

## 2019-03-15 MED ORDER — OXYTOCIN 40 UNITS IN NORMAL SALINE INFUSION - SIMPLE MED: 2.5000 [IU]/h | INTRAVENOUS | Status: DC

## 2019-03-15 MED ORDER — PHENYLEPHRINE 40 MCG/ML (10ML) SYRINGE FOR IV PUSH (FOR BLOOD PRESSURE SUPPORT)
80.0000 ug | PREFILLED_SYRINGE | INTRAVENOUS | Status: DC | PRN
  Administered 2019-03-15: 23:00:00 80 ug via INTRAVENOUS

## 2019-03-15 MED ORDER — OXYTOCIN 40 UNITS IN NORMAL SALINE INFUSION - SIMPLE MED
1.0000 m[IU]/min | INTRAVENOUS | Status: DC
  Administered 2019-03-15: 17:00:00 2 m[IU]/min via INTRAVENOUS
  Filled 2019-03-15: qty 1000

## 2019-03-15 MED ORDER — LIDOCAINE HCL (PF) 1 % IJ SOLN: 30.0000 mL | INTRAMUSCULAR | Status: DC | PRN

## 2019-03-15 MED ORDER — SOD CITRATE-CITRIC ACID 500-334 MG/5ML PO SOLN
30.0000 mL | ORAL | Status: DC | PRN
  Administered 2019-03-16: 30 mL via ORAL
  Filled 2019-03-15: qty 30

## 2019-03-15 MED ORDER — LIDOCAINE HCL (PF) 1 % IJ SOLN
INTRAMUSCULAR | Status: DC | PRN
  Administered 2019-03-15: 5 mL via EPIDURAL

## 2019-03-15 MED ORDER — OXYTOCIN BOLUS FROM INFUSION: 500.0000 mL | Freq: Once | INTRAVENOUS | Status: DC

## 2019-03-15 MED ORDER — TERBUTALINE SULFATE 1 MG/ML IJ SOLN: 0.2500 mg | Freq: Once | INTRAMUSCULAR | Status: DC | PRN

## 2019-03-15 MED ORDER — DIPHENHYDRAMINE HCL 50 MG/ML IJ SOLN: 12.5000 mg | INTRAMUSCULAR | Status: DC | PRN

## 2019-03-15 NOTE — Progress Notes (Signed)
S: Doing well, no complaints, pain well controlled but planning epidural when contractions stronger  O: BP 106/69   Pulse 71   Temp 98 F (36.7 C) (Oral)   Resp 18   Ht 5\' 8"  (1.727 m)   Wt 92.5 kg   BMI 31.02 kg/m    FHT:  FHR: 120s bpm, variability: moderate,  accelerations:  Present,  decelerations:  Absent UC:   irregular, every 2-5 minutes SVE:   Dilation: 4 Effacement (%): 50 Station: -3 Exam by:: Dr. Pamala Hurry AROM clear  A / P:  33 y.o.  OB History  Gravida Para Term Preterm AB Living  2 1 0 0 0 0  SAB TAB Ectopic Multiple Live Births  0 0 0 0 0   at [redacted]w[redacted]d post EDC IOL, late start. pit 2x2, expect more progress now s/p AROM  Fetal Wellbeing:  Category I Pain Control:  Labor support without medications  Anticipated MOD:  NSVD though watch labor curve given LGA  Kayla Trevino 03/15/2019, 9:05 PM

## 2019-03-15 NOTE — Anesthesia Procedure Notes (Signed)
Epidural Patient location during procedure: OB Start time: 03/15/2019 9:40 PM End time: 03/15/2019 9:49 PM  Staffing Anesthesiologist: Barnet Glasgow, MD Performed: anesthesiologist   Preanesthetic Checklist Completed: patient identified, site marked, surgical consent, pre-op evaluation, timeout performed, IV checked, risks and benefits discussed and monitors and equipment checked  Epidural Patient position: sitting Prep: site prepped and draped and DuraPrep Patient monitoring: continuous pulse ox and blood pressure Approach: midline Location: L3-L4 Injection technique: LOR air  Needle:  Needle type: Tuohy  Needle gauge: 17 G Needle length: 9 cm and 9 Needle insertion depth: 7 cm Catheter type: closed end flexible Catheter size: 19 Gauge Catheter at skin depth: 12 cm Test dose: negative  Assessment Events: blood not aspirated, injection not painful, no injection resistance, negative IV test and no paresthesia  Additional Notes Patient identified. Risks/Benefits/Options discussed with patient including but not limited to bleeding, infection, nerve damage, paralysis, failed block, incomplete pain control, headache, blood pressure changes, nausea, vomiting, reactions to medication both or allergic, itching and postpartum back pain. Confirmed with bedside nurse the patient's most recent platelet count. Confirmed with patient that they are not currently taking any anticoagulation, have any bleeding history or any family history of bleeding disorders. Patient expressed understanding and wished to proceed. All questions were answered. Sterile technique was used throughout the entire procedure. Please see nursing notes for vital signs. Test dose was given through epidural needle and negative prior to continuing to dose epidural or start infusion. Warning signs of high block given to the patient including shortness of breath, tingling/numbness in hands, complete motor block, or any  concerning symptoms with instructions to call for help. Patient was given instructions on fall risk and not to get out of bed. All questions and concerns addressed with instructions to call with any issues.1  Attempt (S) . Patient tolerated procedure well.

## 2019-03-15 NOTE — Anesthesia Preprocedure Evaluation (Signed)
Anesthesia Evaluation  Patient identified by MRN, date of birth, ID band Patient awake    Reviewed: Allergy & Precautions, NPO status , Patient's Chart, lab work & pertinent test results  Airway Mallampati: II  TM Distance: >3 FB Neck ROM: Full    Dental no notable dental hx. (+) Teeth Intact   Pulmonary neg pulmonary ROS,    Pulmonary exam normal breath sounds clear to auscultation       Cardiovascular Exercise Tolerance: Good negative cardio ROS Normal cardiovascular exam Rhythm:Regular Rate:Normal     Neuro/Psych negative neurological ROS  negative psych ROS   GI/Hepatic negative GI ROS, Neg liver ROS,   Endo/Other  negative endocrine ROS  Renal/GU negative Renal ROS     Musculoskeletal   Abdominal   Peds  Hematology Hgb 12.4 Plt 188   Anesthesia Other Findings   Reproductive/Obstetrics (+) Pregnancy                             Anesthesia Physical Anesthesia Plan  ASA: II  Anesthesia Plan: Epidural   Post-op Pain Management:    Induction:   PONV Risk Score and Plan:   Airway Management Planned:   Additional Equipment:   Intra-op Plan:   Post-operative Plan:   Informed Consent: I have reviewed the patients History and Physical, chart, labs and discussed the procedure including the risks, benefits and alternatives for the proposed anesthesia with the patient or authorized representative who has indicated his/her understanding and acceptance.     Dental advisory given  Plan Discussed with:   Anesthesia Plan Comments:         Anesthesia [place] Evaluation

## 2019-03-16 ENCOUNTER — Encounter (HOSPITAL_COMMUNITY): Payer: Self-pay | Admitting: Obstetrics and Gynecology

## 2019-03-16 ENCOUNTER — Encounter (HOSPITAL_COMMUNITY)
Admission: AD | Disposition: A | Discharge status: Home / Self Care | Payer: Self-pay | Source: Home / Self Care | Attending: Obstetrics

## 2019-03-16 LAB — CBC
HCT: 29.4 % — ABNORMAL LOW (ref 36.0–46.0)
Hemoglobin: 10.6 g/dL — ABNORMAL LOW (ref 12.0–15.0)
MCH: 33.9 pg (ref 26.0–34.0)
MCHC: 36.1 g/dL — ABNORMAL HIGH (ref 30.0–36.0)
MCV: 93.9 fL (ref 80.0–100.0)
Platelets: 144 10*3/uL — ABNORMAL LOW (ref 150–400)
RBC: 3.13 MIL/uL — ABNORMAL LOW (ref 3.87–5.11)
RDW: 12.6 % (ref 11.5–15.5)
WBC: 12.7 10*3/uL — ABNORMAL HIGH (ref 4.0–10.5)
nRBC: 0 % (ref 0.0–0.2)

## 2019-03-16 LAB — RPR: RPR Ser Ql: NONREACTIVE

## 2019-03-16 SURGERY — Surgical Case
Anesthesia: Epidural | Wound class: Clean Contaminated

## 2019-03-16 MED ORDER — NALBUPHINE SYRINGE 5 MG/0.5 ML
5.0000 mg | INJECTION | Freq: Once | INTRAMUSCULAR | Status: DC | PRN
  Filled 2019-03-16: qty 0.5

## 2019-03-16 MED ORDER — DEXAMETHASONE SODIUM PHOSPHATE 4 MG/ML IJ SOLN
INTRAMUSCULAR | Status: DC | PRN
  Administered 2019-03-16: 4 mg via INTRAVENOUS

## 2019-03-16 MED ORDER — FENTANYL CITRATE (PF) 100 MCG/2ML IJ SOLN
INTRAMUSCULAR | Status: DC | PRN
  Administered 2019-03-16: 100 ug via EPIDURAL

## 2019-03-16 MED ORDER — SODIUM CHLORIDE 0.9 % IV SOLN
INTRAVENOUS | Status: DC | PRN
  Administered 2019-03-16: 02:00:00 40 [IU] via INTRAVENOUS

## 2019-03-16 MED ORDER — ONDANSETRON HCL 4 MG/2ML IJ SOLN
INTRAMUSCULAR | Status: DC | PRN
  Administered 2019-03-16: 4 mg via INTRAVENOUS

## 2019-03-16 MED ORDER — CEFAZOLIN SODIUM-DEXTROSE 2-3 GM-%(50ML) IV SOLR
INTRAVENOUS | Status: DC | PRN
  Administered 2019-03-16: 2 g via INTRAVENOUS

## 2019-03-16 MED ORDER — TETANUS-DIPHTH-ACELL PERTUSSIS 5-2.5-18.5 LF-MCG/0.5 IM SUSP: 0.5000 mL | Freq: Once | INTRAMUSCULAR | Status: DC

## 2019-03-16 MED ORDER — CEFAZOLIN SODIUM-DEXTROSE 2-4 GM/100ML-% IV SOLN
INTRAVENOUS | Status: AC
  Filled 2019-03-16: qty 100

## 2019-03-16 MED ORDER — DIBUCAINE (PERIANAL) 1 % EX OINT: 1.0000 "application " | TOPICAL_OINTMENT | CUTANEOUS | Status: DC | PRN

## 2019-03-16 MED ORDER — IBUPROFEN 800 MG PO TABS
800.0000 mg | ORAL_TABLET | Freq: Three times a day (TID) | ORAL | Status: DC
  Administered 2019-03-16 – 2019-03-18 (×6): 800 mg via ORAL
  Filled 2019-03-16 (×7): qty 1

## 2019-03-16 MED ORDER — NALOXONE HCL 0.4 MG/ML IJ SOLN: 0.4000 mg | INTRAMUSCULAR | Status: DC | PRN

## 2019-03-16 MED ORDER — EPHEDRINE 5 MG/ML INJ
10.0000 mg | Freq: Once | INTRAVENOUS | Status: DC
  Filled 2019-03-16: qty 2

## 2019-03-16 MED ORDER — LIDOCAINE-EPINEPHRINE (PF) 2 %-1:200000 IJ SOLN
INTRAMUSCULAR | Status: AC
  Filled 2019-03-16: qty 10

## 2019-03-16 MED ORDER — NALOXONE HCL 4 MG/10ML IJ SOLN
1.0000 ug/kg/h | INTRAVENOUS | Status: DC | PRN
  Filled 2019-03-16: qty 5

## 2019-03-16 MED ORDER — OXYTOCIN 40 UNITS IN NORMAL SALINE INFUSION - SIMPLE MED: 2.5000 [IU]/h | INTRAVENOUS | Status: AC

## 2019-03-16 MED ORDER — LACTATED RINGERS IV SOLN
INTRAVENOUS | Status: DC
  Administered 2019-03-16: 13:00:00 via INTRAVENOUS

## 2019-03-16 MED ORDER — OXYCODONE HCL 5 MG PO TABS: 5.0000 mg | ORAL_TABLET | Freq: Once | ORAL | Status: DC | PRN

## 2019-03-16 MED ORDER — ONDANSETRON HCL 4 MG/2ML IJ SOLN: 4.0000 mg | Freq: Three times a day (TID) | INTRAMUSCULAR | Status: DC | PRN

## 2019-03-16 MED ORDER — OXYCODONE HCL 5 MG/5ML PO SOLN: 5.0000 mg | Freq: Once | ORAL | Status: DC | PRN

## 2019-03-16 MED ORDER — SODIUM BICARBONATE 8.4 % IV SOLN
INTRAVENOUS | Status: AC
  Filled 2019-03-16: qty 50

## 2019-03-16 MED ORDER — DIPHENHYDRAMINE HCL 25 MG PO CAPS: 25.0000 mg | ORAL_CAPSULE | ORAL | Status: DC | PRN

## 2019-03-16 MED ORDER — WITCH HAZEL-GLYCERIN EX PADS: 1.0000 "application " | MEDICATED_PAD | CUTANEOUS | Status: DC | PRN

## 2019-03-16 MED ORDER — KETOROLAC TROMETHAMINE 30 MG/ML IJ SOLN: 30.0000 mg | Freq: Once | INTRAMUSCULAR | Status: DC | PRN

## 2019-03-16 MED ORDER — SODIUM CHLORIDE 0.9% FLUSH: 3.0000 mL | INTRAVENOUS | Status: DC | PRN

## 2019-03-16 MED ORDER — SENNOSIDES-DOCUSATE SODIUM 8.6-50 MG PO TABS
2.0000 | ORAL_TABLET | ORAL | Status: DC
  Administered 2019-03-16 – 2019-03-17 (×2): 2 via ORAL
  Filled 2019-03-16 (×2): qty 2

## 2019-03-16 MED ORDER — SCOPOLAMINE 1 MG/3DAYS TD PT72
1.0000 | MEDICATED_PATCH | Freq: Once | TRANSDERMAL | Status: DC
  Administered 2019-03-16: 12:00:00 1.5 mg via TRANSDERMAL
  Filled 2019-03-16: qty 1

## 2019-03-16 MED ORDER — ONDANSETRON HCL 4 MG/2ML IJ SOLN
INTRAMUSCULAR | Status: AC
  Filled 2019-03-16: qty 4

## 2019-03-16 MED ORDER — SIMETHICONE 80 MG PO CHEW
80.0000 mg | CHEWABLE_TABLET | Freq: Three times a day (TID) | ORAL | Status: DC
  Administered 2019-03-16 – 2019-03-18 (×8): 80 mg via ORAL
  Filled 2019-03-16 (×8): qty 1

## 2019-03-16 MED ORDER — EPHEDRINE SULFATE 50 MG/ML IJ SOLN
10.0000 mg | Freq: Once | INTRAMUSCULAR | Status: DC
  Filled 2019-03-16: qty 1

## 2019-03-16 MED ORDER — NALBUPHINE SYRINGE 5 MG/0.5 ML
5.0000 mg | INJECTION | INTRAMUSCULAR | Status: DC | PRN
  Filled 2019-03-16: qty 0.5

## 2019-03-16 MED ORDER — PROMETHAZINE HCL 25 MG/ML IJ SOLN
12.5000 mg | INTRAMUSCULAR | Status: DC | PRN
  Administered 2019-03-16: 12.5 mg via INTRAVENOUS
  Filled 2019-03-16: qty 1

## 2019-03-16 MED ORDER — SODIUM CHLORIDE 0.9 % IV SOLN
INTRAVENOUS | Status: DC | PRN
  Administered 2019-03-16: 02:00:00 via INTRAVENOUS

## 2019-03-16 MED ORDER — OXYTOCIN 40 UNITS IN NORMAL SALINE INFUSION - SIMPLE MED
INTRAVENOUS | Status: AC
  Filled 2019-03-16: qty 1000

## 2019-03-16 MED ORDER — DIPHENHYDRAMINE HCL 50 MG/ML IJ SOLN: 12.5000 mg | INTRAMUSCULAR | Status: DC | PRN

## 2019-03-16 MED ORDER — ACETAMINOPHEN 500 MG PO TABS
1000.0000 mg | ORAL_TABLET | Freq: Four times a day (QID) | ORAL | Status: AC
  Administered 2019-03-16 – 2019-03-17 (×4): 1000 mg via ORAL
  Filled 2019-03-16 (×4): qty 2

## 2019-03-16 MED ORDER — DIPHENHYDRAMINE HCL 25 MG PO CAPS: 25.0000 mg | ORAL_CAPSULE | Freq: Four times a day (QID) | ORAL | Status: DC | PRN

## 2019-03-16 MED ORDER — LACTATED RINGERS IV SOLN
INTRAVENOUS | Status: DC | PRN
  Administered 2019-03-16 (×2): via INTRAVENOUS

## 2019-03-16 MED ORDER — MORPHINE SULFATE (PF) 0.5 MG/ML IJ SOLN
INTRAMUSCULAR | Status: AC
  Filled 2019-03-16: qty 10

## 2019-03-16 MED ORDER — COCONUT OIL OIL
1.0000 "application " | TOPICAL_OIL | Status: DC | PRN
  Administered 2019-03-17: 1 via TOPICAL

## 2019-03-16 MED ORDER — OXYCODONE-ACETAMINOPHEN 5-325 MG PO TABS
1.0000 | ORAL_TABLET | ORAL | Status: DC | PRN
  Filled 2019-03-16: qty 1

## 2019-03-16 MED ORDER — PRENATAL MULTIVITAMIN CH
1.0000 | ORAL_TABLET | Freq: Every day | ORAL | Status: DC
  Administered 2019-03-17 – 2019-03-18 (×2): 1 via ORAL
  Filled 2019-03-16 (×2): qty 1

## 2019-03-16 MED ORDER — SIMETHICONE 80 MG PO CHEW: 80.0000 mg | CHEWABLE_TABLET | ORAL | Status: DC | PRN

## 2019-03-16 MED ORDER — PHENYLEPHRINE 40 MCG/ML (10ML) SYRINGE FOR IV PUSH (FOR BLOOD PRESSURE SUPPORT)
PREFILLED_SYRINGE | INTRAVENOUS | Status: AC
  Filled 2019-03-16: qty 10

## 2019-03-16 MED ORDER — ZOLPIDEM TARTRATE 5 MG PO TABS: 5.0000 mg | ORAL_TABLET | Freq: Every evening | ORAL | Status: DC | PRN

## 2019-03-16 MED ORDER — HYDROMORPHONE HCL 1 MG/ML IJ SOLN: 0.2500 mg | INTRAMUSCULAR | Status: DC | PRN

## 2019-03-16 MED ORDER — SIMETHICONE 80 MG PO CHEW
80.0000 mg | CHEWABLE_TABLET | ORAL | Status: DC
  Administered 2019-03-16 – 2019-03-17 (×2): 80 mg via ORAL
  Filled 2019-03-16 (×2): qty 1

## 2019-03-16 MED ORDER — KETOROLAC TROMETHAMINE 30 MG/ML IJ SOLN
30.0000 mg | Freq: Four times a day (QID) | INTRAMUSCULAR | Status: AC | PRN
  Filled 2019-03-16: qty 1

## 2019-03-16 MED ORDER — DEXAMETHASONE SODIUM PHOSPHATE 4 MG/ML IJ SOLN
INTRAMUSCULAR | Status: AC
  Filled 2019-03-16: qty 1

## 2019-03-16 MED ORDER — KETOROLAC TROMETHAMINE 30 MG/ML IJ SOLN
30.0000 mg | Freq: Four times a day (QID) | INTRAMUSCULAR | Status: AC | PRN
  Administered 2019-03-16 (×2): 30 mg via INTRAVENOUS
  Filled 2019-03-16: qty 1

## 2019-03-16 MED ORDER — ONDANSETRON HCL 4 MG/2ML IJ SOLN
4.0000 mg | Freq: Three times a day (TID) | INTRAMUSCULAR | Status: DC | PRN
  Administered 2019-03-16: 06:00:00 4 mg via INTRAVENOUS
  Filled 2019-03-16: qty 2

## 2019-03-16 MED ORDER — MORPHINE SULFATE (PF) 0.5 MG/ML IJ SOLN
INTRAMUSCULAR | Status: DC | PRN
  Administered 2019-03-16: 3 mg via EPIDURAL

## 2019-03-16 MED ORDER — LIDOCAINE-EPINEPHRINE (PF) 2 %-1:200000 IJ SOLN
INTRAMUSCULAR | Status: DC | PRN
  Administered 2019-03-16: 3 mL via EPIDURAL
  Administered 2019-03-16: 2 mL via EPIDURAL
  Administered 2019-03-16 (×2): 5 mL via EPIDURAL

## 2019-03-16 MED ORDER — FENTANYL CITRATE (PF) 100 MCG/2ML IJ SOLN
INTRAMUSCULAR | Status: AC
  Filled 2019-03-16: qty 2

## 2019-03-16 MED ORDER — MENTHOL 3 MG MT LOZG: 1.0000 | LOZENGE | OROMUCOSAL | Status: DC | PRN

## 2019-03-16 MED ORDER — ONDANSETRON HCL 4 MG/2ML IJ SOLN: 4.0000 mg | Freq: Once | INTRAMUSCULAR | Status: DC | PRN

## 2019-03-16 MED ORDER — PHENYLEPHRINE HCL (PRESSORS) 10 MG/ML IV SOLN
INTRAVENOUS | Status: DC | PRN
  Administered 2019-03-16 (×6): 80 ug via INTRAVENOUS

## 2019-03-16 SURGICAL SUPPLY — 39 items
BENZOIN TINCTURE PRP APPL 2/3 (GAUZE/BANDAGES/DRESSINGS) ×4 IMPLANT
CHLORAPREP W/TINT 26ML (MISCELLANEOUS) ×4 IMPLANT
CLAMP CORD UMBIL (MISCELLANEOUS) IMPLANT
CLOSURE STERI STRIP 1/2 X4 (GAUZE/BANDAGES/DRESSINGS) ×4 IMPLANT
CLOSURE WOUND 1/2 X4 (GAUZE/BANDAGES/DRESSINGS)
CLOTH BEACON ORANGE TIMEOUT ST (SAFETY) ×4 IMPLANT
DRSG OPSITE POSTOP 4X10 (GAUZE/BANDAGES/DRESSINGS) ×4 IMPLANT
ELECT REM PT RETURN 9FT ADLT (ELECTROSURGICAL) ×4
ELECTRODE REM PT RTRN 9FT ADLT (ELECTROSURGICAL) ×2 IMPLANT
EXTRACTOR VACUUM M CUP 4 TUBE (SUCTIONS) IMPLANT
EXTRACTOR VACUUM M CUP 4' TUBE (SUCTIONS)
GLOVE BIO SURGEON STRL SZ 6.5 (GLOVE) ×3 IMPLANT
GLOVE BIO SURGEONS STRL SZ 6.5 (GLOVE) ×1
GLOVE BIOGEL PI IND STRL 7.0 (GLOVE) ×4 IMPLANT
GLOVE BIOGEL PI INDICATOR 7.0 (GLOVE) ×4
GOWN STRL REUS W/TWL LRG LVL3 (GOWN DISPOSABLE) ×8 IMPLANT
KIT ABG SYR 3ML LUER SLIP (SYRINGE) IMPLANT
NEEDLE HYPO 22GX1.5 SAFETY (NEEDLE) IMPLANT
NEEDLE HYPO 25X5/8 SAFETYGLIDE (NEEDLE) IMPLANT
NS IRRIG 1000ML POUR BTL (IV SOLUTION) ×4 IMPLANT
PACK C SECTION WH (CUSTOM PROCEDURE TRAY) ×4 IMPLANT
PAD OB MATERNITY 4.3X12.25 (PERSONAL CARE ITEMS) ×4 IMPLANT
PENCIL SMOKE EVAC W/HOLSTER (ELECTROSURGICAL) ×4 IMPLANT
RETRACTOR WND ALEXIS 25 LRG (MISCELLANEOUS) ×2 IMPLANT
RTRCTR WOUND ALEXIS 25CM LRG (MISCELLANEOUS) ×4
STRIP CLOSURE SKIN 1/2X4 (GAUZE/BANDAGES/DRESSINGS) IMPLANT
SUT CHROMIC 2 0 CT 1 (SUTURE) ×8 IMPLANT
SUT MON AB 4-0 PS1 27 (SUTURE) ×4 IMPLANT
SUT PLAIN 0 NONE (SUTURE) IMPLANT
SUT PLAIN 2 0 XLH (SUTURE) IMPLANT
SUT VIC AB 0 CT1 36 (SUTURE) ×8 IMPLANT
SUT VIC AB 0 CTX 36 (SUTURE) ×6
SUT VIC AB 0 CTX36XBRD ANBCTRL (SUTURE) ×6 IMPLANT
SUT VIC AB 2-0 CT1 27 (SUTURE) ×4
SUT VIC AB 2-0 CT1 TAPERPNT 27 (SUTURE) ×4 IMPLANT
SYR CONTROL 10ML LL (SYRINGE) IMPLANT
TOWEL OR 17X24 6PK STRL BLUE (TOWEL DISPOSABLE) ×4 IMPLANT
TRAY FOLEY W/BAG SLVR 14FR LF (SET/KITS/TRAYS/PACK) IMPLANT
WATER STERILE IRR 1000ML POUR (IV SOLUTION) ×4 IMPLANT

## 2019-03-16 NOTE — Addendum Note (Signed)
Addendum  created 03/16/19 0749 by Alvy Bimler, CRNA   Clinical Note Signed

## 2019-03-16 NOTE — Addendum Note (Signed)
Addendum  created 03/16/19 0950 by Lyn Hollingshead, MD   Order list changed

## 2019-03-16 NOTE — Transfer of Care (Signed)
Immediate Anesthesia Transfer of Care Note  Patient: Kayla Trevino  Procedure(s) Performed: CESAREAN SECTION WITH BILATERAL TUBAL LIGATION (Bilateral )  Patient Location: PACU  Anesthesia Type:Epidural  Level of Consciousness: awake, alert  and oriented  Airway & Oxygen Therapy: Patient Spontanous Breathing  Post-op Assessment: Report given to RN and Post -op Vital signs reviewed and stable  Post vital signs: Reviewed and stable HR 83, RR 16, BP 98/59, SaO2 99%  Last Vitals:  Vitals Value Taken Time  BP    Temp    Pulse    Resp    SpO2      Last Pain:  Vitals:   03/16/19 0016  TempSrc: Oral  PainSc:       Patients Stated Pain Goal: 7 ([id number])  Complications: No apparent anesthesia complications

## 2019-03-16 NOTE — Lactation Note (Signed)
This note was copied from a baby's chart. Lactation Consultation Note  Patient Name: Kayla Trevino [id number] Date: 03/16/2019  Randel Books is 45 hours old and just came off the breast.  Mom is feeling very nauseated and has received IV medication.  I will follow up later.   Maternal Data    Feeding Feeding Type: Breast Fed  LATCH Score Latch: Grasps breast easily, tongue down, lips flanged, rhythmical sucking.  Audible Swallowing: A few with stimulation  Type of Nipple: Everted at rest and after stimulation  Comfort (Breast/Nipple): Soft / non-tender  Hold (Positioning): Assistance needed to correctly position infant at breast and maintain latch.  LATCH Score: 8  Interventions    Lactation Tools Discussed/Used     Consult Status      Ave Filter 03/16/2019, 10:40 AM

## 2019-03-16 NOTE — Op Note (Signed)
03/16/2019  3:00 AM  PATIENT:  Kayla Trevino  33 y.o. female  PRE-OPERATIVE DIAGNOSIS:  non reassuring fetal tolerance of labor; arrest of descent, suspected LGA, undesired fertility   POST-OPERATIVE DIAGNOSIS:  non reassuring fetal tolerance of labor; arrest of descent, suspected LGA, undesired fertility , left ovarian cyst  PROCEDURE:  Procedure(s): CESAREAN SECTION WITH BILATERAL TUBAL LIGATION (Bilateral) Low-transverse cesarean section with 2 layer closure Primary cesarean section Bilateral tubal ligation Left ovarian cystectomy  SURGEON:  Surgeon(s) and Role:    * Aloha Gell, MD - Primary  PHYSICIAN ASSISTANT: Lars Pinks, CNM  ASSISTANTS: As above  ANESTHESIA:   epidural  EBL: Per anesthesia notes  BLOOD ADMINISTERED:none  DRAINS: Urinary Catheter (Foley)   LOCAL MEDICATIONS USED:  NONE  SPECIMEN:  Source of Specimen:  Placenta, bilateral fallopian tube segments, left ovarian cyst wall  DISPOSITION OF SPECIMEN:  PATHOLOGY  COUNTS:  YES  TOURNIQUET:  * No tourniquets in log *  DICTATION: .Dragon Dictation  PLAN OF CARE: Admit to inpatient   PATIENT DISPOSITION:  PACU - hemodynamically stable.   Delay start of Pharmacological VTE agent (>24hrs) due to surgical blood loss or risk of bleeding: yes     Findings:  @BABYSEXEBC @ infant,  APGAR (1 MIN): 9   APGAR (5 MINS): 9   APGAR (10 MINS):   Normal uterus, tubes and right ovary.  Left ovary with 6 cm simple appearing cyst,, normal placenta. 3VC, clear amniotic fluid, nuchal cord x1  EBL: Per anesthesia notes cc Antibiotics:   2g Ancef Complications: none  Indications: This is a 33 y.o. year-old, G2, P1 at [redacted]w[redacted]d admitted for post [facility] induction of labor.  Patient had excellent progress to 10 cm with Pitocin and artificial rupture of membranes.  However once patient reached 10 cm persistent variable and occasional late decelerations occurred.  After she started pushing she had a 8 to 10-minute  D-cell into the 70s and eventually recovered with position change in getting patient into the hands and knees position.  Once fetal recovery occurred patient tried pushing in the hands and knees position with again D cells with pushing.  She was then switched to pushing on her side and then on her back and again decelerations with every push.  Decelerations were deep into the 60s but would recover.  Despite 1 hour of pushing with deep decelerations fetal vertex did not pass the 0 station.  Given concern for fetal wellbeing and cephalopelvic disproportion decision was made to proceed with cesarean section.  Patient also requested tubal ligation which was discussed in the outpatient setting as well.. Risks benefits and alternatives of the procedure were discussed with the patient who agreed to proceed  Procedure:  After informed consent was obtained the patient was taken to the operating room where epidural anesthesia was found to be adequate.  She was prepped and draped quickly with Betadine given the concerns for nonreassuring fetal testing, also in dorsal supine position with a leftward tilt.  A foley catheter was in place.  A Pfannenstiel skin incision was made 2 cm above the pubic symphysis in the midline with the scalpel.  Dissection was carried down with the Bovie cautery until the fascia was reached. The fascia was incised in the midline. The incision was extended laterally with the Mayo scissors. The inferior aspect of the fascial incision was grasped with the Coker clamps, elevated up and the underlying rectus muscles were dissected off sharply. The superior aspect of the fascial incision was grasped with  the Coker clamps elevated up and the underlying rectus muscles were dissected off sharply.  The peritoneum was entered bluntly. The peritoneal incision was extended superiorly and inferiorly with good visualization of the bladder. The bladder blade was inserted and palpation was done to assess the fetal  position and the location of the uterine vessels.  Given that the fetal vertex was wedged from excellent maternal expulsive efforts a nurse placed the hand in the vagina to elevate the head cephalad. the lower segment of the uterus was incised sharply with the scalpel and extended  bluntly in the cephalo-caudal fashion. The infant was grasped, brought to the incision,  rotated and the infant was delivered with fundal pressure after nuchal cord was reduced. The nose and mouth were bulb suctioned. The cord was clamped and cut after 1 minute delay. The infant was handed off to the waiting pediatrician. The placenta was expressed. The uterus was exteriorized. The uterus was cleared of all clots and debris. The uterine incision was repaired with 0 Vicryl in a running locked fashion.  A second layer of the same suture was used in an imbricating fashion to obtain excellent hemostasis.   The tubes and ovaries were evaluated, pt again confirmed her desire for a tubal ligation. The right tube was visualized out to its fimbriated end. A Babcock clamp was used to elevated the mid-isthmic portion of the tube. A free tie of plan gut suture was used to tie off a 2 cm knuckle of tube. A second free tie was placed just below the first. A window was created in the mesosalpynx and the the knuckle of tube was transected. An identical procedure was carried out on the contralateral side. After the uterus was returned to the abdomen, using retraction, the transection sites were visualized and found to be hemostatic.   The left ovarian cyst was evaluated.  It appeared simple but approximately 6 cm.  Discussing with the patient revealed the cyst predated the pregnancy.  Informed consent was obtained and decision was made to remove the cyst.  Blue towels and surgical sponges were placed around the base of the ovary.  A 1 cm incision was made with the Bovie cautery and the edges were grasped with Allis clamps.  Yankauer suction was used  to collect the clear fluid that was draining from the cyst.  As the incision was extended slightly the Yankauer was placed inside the cyst to remove all the cyst fluid.  No cyst fluid spilled into the abdomen or pelvis.  Using Allis clamps on the edge of the ovary the cyst wall was dissected from the healthy ovarian tissue.  Using traction countertraction and Allis clamps the entirety of the cyst wall was removed from the ovary.  Cautery was used on the edges and particularly at the base.  Hemostasis was noted and the incised area of the ovary was closed with running locked 2-0 Vicryl.    The uterus was then returned to the abdomen, the gutters were cleared of all clots and debris. The uterine incision was reinspected and an additional figure-of-eight suture was placed in the midpoint of the incision.  The uterus was then found to be hemostatic.  The left ovary and bilateral tubal ligation sites were reevaluated and found to be hemostatic.  The peritoneum was grasped and closed with 2-0 Vicryl in a running fashion. The cut muscle edges and the underside of the fascia were inspected and found to be hemostatic. The fascia was closed with 0  Vicryl in a single layer . The subcutaneous tissue was irrigated. Scarpa's layer was closed with a 2-0 plain gut suture. The skin was closed with a 4-0 Monocryl in a single layer. The patient tolerated the procedure well. Sponge lap and needle counts were correct x3 and patient was taken to the recovery room in a stable condition.  Ala Dach 03/16/2019 3:03 AM

## 2019-03-16 NOTE — Progress Notes (Signed)
Notified Dr Clide Cliff, anesthesia, regarding pt's second emesis, zofran at 0553, BP 90/5 HR56, urine output 559ml  Since 0430 AM. Motrin given at 0800, need additional pain meds. New orders received.

## 2019-03-16 NOTE — Progress Notes (Signed)
Patient ID: Kayla Trevino, female   DOB: 01-Aug-1985, 33 y.o.   MRN: [id number] Subjective: POD# 0 Live born female  Birth Weight: 8 lb 11.3 oz (3950 g) APGAR: 9, 9  Newborn Delivery   Birth date/time: 03/16/2019 01:34:00 Delivery type: C-Section, Low Vertical C-section categorization: Primary     Baby name: Mikeal Hawthorne Delivering provider: Aloha Gell   Resting in bed, feeling very sleepy after Phenergan, has been nauseated since surgery. No flatus yet, pain controlled with TPO meds. Bleeding is light, no clots. Foley in place, has not been OOB yet.   Objective:   VS:    Vitals:   03/16/19 0530 03/16/19 0745 03/16/19 0941 03/16/19 1030  BP: 103/72 102/80 (!) 90/51 96/60  Pulse: 62 61 (!) 56 (!) 52  Resp: 14 18 18 18   Temp: 99.7 F (37.6 C) 98 F (36.7 C) 98.6 F (37 C) 98 F (36.7 C)  TempSrc: Oral  Oral Oral  SpO2: 96% 97%  97%  Weight:      Height:          Intake/Output Summary (Last 24 hours) at 03/16/2019 1106 Last data filed at 03/16/2019 0900 Gross per 24 hour  Intake 2780 ml  Output 1398 ml  Net 1382 ml        Recent Labs    03/15/19 1552 03/16/19 0553  WBC 9.5 12.7*  HGB 12.4 10.6*  HCT 36.4 29.4*  PLT 188 144*     Blood type: --/--/O POS, O POS Performed at [facility] [address] [place], [place] [address]  [phone])  Rubella: Immune (03/13 0000)  Vaccines: TDaP UTD         Flu    UTD    Physical Exam:  General: cooperative, no distress and sleepy Incision: intact and serous and small amount drainage present Uterine Fundus: firm, below umbilicus, nontender Lochia: minimal Ext: no edema, redness or tenderness in the calves or thighs      Assessment/Plan: 33 y.o.   POD# 0. [id number]                  Principal Problem:   Postpartum care following cesarean delivery 10/7 Active Problems:   Encounter for induction of labor   Cesarean delivery - FITL, AOD   Doing well, stable.     DC foley at 12 hrs post-op           Advance diet as tolerated Encourage rest when baby rests Breastfeeding support Encourage to ambulate Routine post-op care  Juliene Pina, CNM, MSN 03/16/2019, 11:06 AM

## 2019-03-16 NOTE — Anesthesia Postprocedure Evaluation (Signed)
Anesthesia Post Note  Patient: Kayla Trevino  Procedure(s) Performed: CESAREAN SECTION WITH BILATERAL TUBAL LIGATION (Bilateral )     Patient location during evaluation: Mother Baby Anesthesia Type: Epidural Level of consciousness: awake and alert Pain management: pain level controlled Vital Signs Assessment: post-procedure vital signs reviewed and stable Respiratory status: spontaneous breathing, nonlabored ventilation and respiratory function stable Cardiovascular status: stable Postop Assessment: no headache, no backache and epidural receding Anesthetic complications: no    Last Vitals:  Vitals:   03/16/19 0430 03/16/19 0530  BP: (!) 92/55 103/72  Pulse: (!) 58 62  Resp: 16 14  Temp: 36.9 C 37.6 C  SpO2: 100% 96%    Last Pain:  Vitals:   03/16/19 0530  TempSrc: Oral  PainSc: 0-No pain   Pain Goal: Patients Stated Pain Goal: 7 (03/15/19 2003)                 Gilmer Mor

## 2019-03-16 NOTE — Brief Op Note (Signed)
03/16/2019  3:00 AM  PATIENT:  Kayla Trevino  33 y.o. female  PRE-OPERATIVE DIAGNOSIS:  non reassuring fetal tolerance of labor; arrest of descent, suspected LGA, undesired fertility   POST-OPERATIVE DIAGNOSIS:  non reassuring fetal tolerance of labor; arrest of descent, suspected LGA, undesired fertility , left ovarian cyst  PROCEDURE:  Procedure(s): CESAREAN SECTION WITH BILATERAL TUBAL LIGATION (Bilateral) Low-transverse cesarean section with 2 layer closure Primary cesarean section Bilateral tubal ligation Left ovarian cystectomy  SURGEON:  Surgeon(s) and Role:    * Aloha Gell, MD - Primary  PHYSICIAN ASSISTANT: Lars Pinks, CNM  ASSISTANTS: As above  ANESTHESIA:   epidural  EBL: Per anesthesia notes  BLOOD ADMINISTERED:none  DRAINS: Urinary Catheter (Foley)   LOCAL MEDICATIONS USED:  NONE  SPECIMEN:  Source of Specimen:  Placenta, bilateral fallopian tube segments, left ovarian cyst wall  DISPOSITION OF SPECIMEN:  PATHOLOGY  COUNTS:  YES  TOURNIQUET:  * No tourniquets in log *  DICTATION: .Dragon Dictation  PLAN OF CARE: Admit to inpatient   PATIENT DISPOSITION:  PACU - hemodynamically stable.   Delay start of Pharmacological VTE agent (>24hrs) due to surgical blood loss or risk of bleeding: yes

## 2019-03-16 NOTE — Anesthesia Postprocedure Evaluation (Signed)
Anesthesia Post Note  Patient: Kayla Trevino  Procedure(s) Performed: CESAREAN SECTION WITH BILATERAL TUBAL LIGATION (Bilateral )     Patient location during evaluation: Mother Baby Anesthesia Type: Epidural Level of consciousness: oriented and awake and alert Pain management: pain level controlled Vital Signs Assessment: post-procedure vital signs reviewed and stable Respiratory status: spontaneous breathing and respiratory function stable Cardiovascular status: blood pressure returned to baseline and stable Postop Assessment: no headache, no backache, no apparent nausea or vomiting and able to ambulate Anesthetic complications: no    Last Vitals:  Vitals:   03/16/19 0302 03/16/19 0303  BP:    Pulse: 87 88  Resp: 19 (!) 21  Temp:    SpO2: 99% 98%    Last Pain:  Vitals:   03/16/19 0300  TempSrc:   PainSc: 0-No pain   Pain Goal: Patients Stated Pain Goal: 7 (03/15/19 2003)              Epidural/Spinal Function Cutaneous sensation: Able to Wiggle Toes (03/16/19 0300), Patient able to flex knees: No (03/16/19 0300), Patient able to lift hips off bed: No (03/16/19 0300), Back pain beyond tenderness at insertion site: No (03/16/19 0300), Progressively worsening motor and/or sensory loss: No (03/16/19 0300), Bowel and/or bladder incontinence post epidural: No (03/16/19 0300)  Barnet Glasgow

## 2019-03-17 MED ORDER — POLYSACCHARIDE IRON COMPLEX 150 MG PO CAPS
150.0000 mg | ORAL_CAPSULE | Freq: Every day | ORAL | Status: DC
  Administered 2019-03-18: 150 mg via ORAL
  Filled 2019-03-17: qty 1

## 2019-03-17 MED ORDER — ACETAMINOPHEN 325 MG PO TABS
650.0000 mg | ORAL_TABLET | ORAL | Status: DC | PRN
  Administered 2019-03-17 – 2019-03-18 (×3): 650 mg via ORAL
  Filled 2019-03-17 (×3): qty 2

## 2019-03-17 MED ORDER — OXYCODONE HCL 5 MG PO TABS
5.0000 mg | ORAL_TABLET | ORAL | Status: DC | PRN
  Administered 2019-03-17: 5 mg via ORAL
  Administered 2019-03-17: 10 mg via ORAL
  Administered 2019-03-17: 5 mg via ORAL
  Administered 2019-03-18 (×3): 10 mg via ORAL
  Filled 2019-03-17: qty 1
  Filled 2019-03-17 (×3): qty 2
  Filled 2019-03-17: qty 1
  Filled 2019-03-17: qty 2

## 2019-03-17 MED ORDER — MAGNESIUM OXIDE 400 (241.3 MG) MG PO TABS
400.0000 mg | ORAL_TABLET | Freq: Every day | ORAL | Status: DC
  Administered 2019-03-18: 09:00:00 400 mg via ORAL
  Filled 2019-03-17: qty 1

## 2019-03-17 NOTE — Progress Notes (Addendum)
POSTOPERATIVE DAY # 1 S/P Primary LTCS and s/p BTL for fetal intolerance to labor and arrest of descent, baby boy "Mikeal Hawthorne"   S:         Reports feeling sore, but overall well             Tolerating po intake / no nausea / no vomiting / + flatus / no BM  Denies dizziness, SOB, or CP             Bleeding is light             Pain controlled withMotrin, Tylenol, and Oxycodone IR             Up ad lib / ambulatory/ voiding QS  Newborn breast feeding - going well, baby on phototherapy  / Circumcision - planning   O:  VS: BP (!) 86/50 (BP Location: Left Arm)   Pulse 61   Temp 98 F (36.7 C) (Oral)   Resp 19   Ht 5\' 8"  (1.727 m)   Wt 92.5 kg   SpO2 99%   Breastfeeding Unknown   BMI 31.02 kg/m    LABS:               Recent Labs    03/15/19 1552 03/16/19 0553  WBC 9.5 12.7*  HGB 12.4 10.6*  PLT 188 144*               Bloodtype: --/--/O POS, O POS Performed at [facility], [place] [address], [place], [place] [address]  [phone] 1552)  Rubella: Immune (03/13 0000)                                             I&O: Intake/Output      10/07 0701 - 10/08 0700 10/08 0701 - 10/09 0700   P.O. 1440    I.V. (mL/kg) 1025 (11.1)    Total Intake(mL/kg) 2465 (26.6)    Urine (mL/kg/hr) 2050 (0.9) 450 (0.5)   Emesis/NG output 0    Blood     Total Output 2050 450   Net +415 -450        Emesis Occurrence 50 x                 Physical Exam:             Alert and Oriented X3  Lungs: Clear and unlabored  Heart: regular rate and rhythm / no murmurs  Abdomen: soft, non-tender, moderate gaseous distention              Fundus: firm, non-tender, U-1             Dressing: honeycomb with steri-strips, marked old drainage noted, otherwise c/d/i              Incision:  approximated with sutuers / no erythema / no ecchymosis / no drainage  Perineum: intact  Lochia: scant rubra on pad   Extremities: trace LE edema, no calf pain or tenderness,   A:        POD # 1 S/P Primary LTCS and s/p  BTL            Mild ABL Anemia   P:        Routine postoperative care              Ambulation encouraged   Niferex 150mg   PO daily  Magnesium oxide 400mg  daily   Continue working with lactation  Desires early d/c home tomorrow   Lars Pinks, MSN, CNM [facility]

## 2019-03-17 NOTE — Lactation Note (Signed)
This note was copied from a baby's chart. Lactation Consultation Note  Patient Name: [name] [id number] Date: 03/17/2019 Reason for consult: Follow-up assessment;Hyperbilirubinemia;Term Baby is 32 hours old/5% weight loss.  Baby was started on phototherapy.  Mom breastfed her first baby for 13 months.  She reports that newborn is latching and feeding well.  She states baby is awake and active at feeds.  He cluster fed last night.  Instructed to use good breast massage during feeding.  Discussed possible sleepiness with jaundice.  Instructed to let us know if he becomes sleepy so we can initiate pumping.  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  I reviewed her choices and she will look at pumps and decide.  Encouraged to call for assist/concerns prn.  Maternal Data    Feeding Feeding Type: Breast Fed  LATCH Score Latch: Grasps breast easily, tongue down, lips flanged, rhythmical sucking.  Audible Swallowing: A few with stimulation  Type of Nipple: Everted at rest and after stimulation     Hold (Positioning): No assistance needed to correctly position infant at breast.     Interventions    Lactation Tools Discussed/Used     Consult Status Consult Status: Follow-up Date: 03/18/19 Follow-up type: In-patient    Ave Filter 03/17/2019, 10:27 AM

## 2019-03-18 LAB — SURGICAL PATHOLOGY

## 2019-03-18 MED ORDER — BISACODYL 10 MG RE SUPP: 10.0000 mg | Freq: Every day | RECTAL | Status: DC | PRN

## 2019-03-18 MED ORDER — IBUPROFEN 800 MG PO TABS: 800.0000 mg | ORAL_TABLET | Freq: Three times a day (TID) | ORAL | 0 refills | Status: DC

## 2019-03-18 MED ORDER — POLYSACCHARIDE IRON COMPLEX 150 MG PO CAPS: 150.0000 mg | ORAL_CAPSULE | Freq: Every day | ORAL | 0 refills | Status: DC

## 2019-03-18 MED ORDER — MAGNESIUM OXIDE 400 (241.3 MG) MG PO TABS: 400.0000 mg | ORAL_TABLET | Freq: Every day | ORAL | 0 refills | Status: DC

## 2019-03-18 MED ORDER — OXYCODONE HCL 5 MG PO TABS: 5.0000 mg | ORAL_TABLET | ORAL | 0 refills | Status: AC | PRN

## 2019-03-18 MED FILL — IBUPROFEN 800 MG TABS: 800 | 10 days supply | Qty: 30 | Fill #0

## 2019-03-18 MED FILL — oxyCODONE HCL 5 MG TABS: 5 | 5 days supply | Qty: 20 | Fill #0

## 2019-03-18 MED FILL — MAGNESIUM OXIDE 400 MG TABS: 400 | 30 days supply | Qty: 30 | Fill #0

## 2019-03-18 MED FILL — POLY-IRON 150 MG CAPSULE: 150 | 30 days supply | Qty: 30 | Fill #0

## 2019-03-18 NOTE — Progress Notes (Signed)
POSTOPERATIVE DAY # 2 S/P CS-fetal intolerance of labor  Subjective:  reports feeling ok - more sore today with shoulder pain tolerating po intake / no nausea / no vomiting / some flatus / no BM pain controlled  up ad lib / not ambulating much / voiding QS  Newborn Breast / Circumcision planned - phototherapy since yesterday (bilrubin level pending this am)  Objective: VS: BP (!) 88/55 (BP Location: Right Arm)   Pulse 77   Temp 97.7 F (36.5 C)   Resp 18   Ht 5\' 8"  (1.727 m)   Wt 92.5 kg   SpO2 98%   Breastfeeding Unknown   BMI 31.02 kg/m        LABS:  Recent Labs    03/15/19 1552 03/16/19 0553  WBC 9.5 12.7*  HGB 12.4 10.6*  PLT 188 144*    Bloodtype:O pos  Rubella: Immune (03/13 0000)   Flu and tdap current - 2020                                         I&O: Intake/Output      10/08 0701 - 10/09 0700 10/09 0701 - 10/10 0700   P.O.     I.V. (mL/kg)     Total Intake(mL/kg)     Urine (mL/kg/hr) 450 (0.2)    Emesis/NG output     Total Output 450    Net -450          Physical Exam: alert and oriented X3 without any distress or pain lung fields are clear and unlabored heart rate regular / normal rhythm / no mumurs abdomen soft, non-tender, non-distended  / bowel sounds are hypoactive uterine fundus firm, non-tender, Ueven surgical incision assessed with honeycomb dressing intact with marked drainage   perineum: intact lochia: light to scant extremities: trace edema, no calf pain or tenderness  Assessment / Plan:   POD # 2 S/P CS for fetal intolerance to labor routine postoperative care  Recommend ambulation - warm fluids to increase bowel motility Anticipate DC tomorrow AM  Artelia Laroche CNM, MSN, [facility] 03/18/2019, 9:22 AM

## 2019-03-18 NOTE — Lactation Note (Signed)
This note was copied from a baby's chart. Lactation Consultation Note  Patient Name: Kayla Trevino [id number] Date: 03/18/2019 Reason for consult: Follow-up assessment;Hyperbilirubinemia;Term  F/U visit with P2 mom who delivered @ 40.6 wks and baby is now 75 hours old; anticipating discharge today. Baby with 6% wt loss at this time and undergoing phototherapy for jaundice.  LC entered room to find mom feeding baby on left breast in modified cradle hold. Mom c/o some initial pain with latching but then resolves as feeding progresses. Mom states her milk is starting to come in; breasts are filling.  Reviewed feeding positions and holds; mom requests to switch to football hold on same breast. Once latch released, mom's nipple noted to be misshapen like lipstick tube and erythema noted. Encouraged mom to get all the nipple plus as much areola as possible to ensure deep latch. Reviewed latch techniques and visual signs of adequate latch re: flanged lips and wide angle of mouth. Drops noted on mom's nipple as she using C-hold to reattach infant. Celebrated with mom as signs of mature milk production noted. Audible swallows noted with nearly every suckle.  Reviewed nipple care after feeds re: EBM and/or coconut oil and allow to air dry. Comfort gels at bedside in fridge and mom verbalizes use.  Reviewed engorgement prevention and relief techniques with ice, hand expression, DEBP if needed. Handout with engorgement tips provided.  Mom is [employer]. Freestyle Flex DEBP provided to mom.  Reviewed expected feeding behavior and output. Reviewed stool changes. Infant audibly stooling in diaper during assessment.  Mom verbalizes plan for circumcision soon. Reviewed anticipated feeding behaviors post circumcision.  Parents with comments regarding "tongue-tie;" states previous staff members have mentioned it. Oral anatomy assessed and very slight curving of tongue during crying, no significant  lingual frenum noted. Infant able to extend tongue over bottom jaw. Parents given list of resources and encouraged to research if desires to pursue release of possible tie.  Reviewed OP lactation services and lactation brochure with phone number at bedside. Mom already familiar with [email or web address] resources.  Maternal Data Does the patient have breastfeeding experience prior to this delivery?: Yes  Feeding Feeding Type: Breast Fed  LATCH Score Latch: Grasps breast easily, tongue down, lips flanged, rhythmical sucking.  Audible Swallowing: Spontaneous and intermittent  Type of Nipple: Everted at rest and after stimulation  Comfort (Breast/Nipple): Filling, red/small blisters or bruises, mild/mod discomfort  Hold (Positioning): No assistance needed to correctly position infant at breast.  LATCH Score: 9  Interventions Interventions: Breast feeding basics reviewed;Skin to skin;Breast massage;Hand express;Pre-pump if needed;Expressed milk;Position options;Support pillows;DEBP;Ice;Adjust position  Lactation Tools Discussed/Used WIC Program: No Pump Review: Setup, frequency, and cleaning;Milk Storage   Consult Status Consult Status: Complete    Cranston Neighbor 03/18/2019, 12:17 PM

## 2019-03-18 NOTE — Discharge Summary (Signed)
OB Discharge Summary  Patient Name: Kayla Trevino DOB: 1985/06/11 MRN: [id number]  Date of admission: 03/15/2019 Intrauterine pregnancy: [redacted]w[redacted]d   Admitting diagnosis: Pregnancy - elective induction of labor Secondary diagnosis: Suspected LGA  Date of discharge: 03/18/2019    Discharge diagnosis: Term Pregnancy Delivered   / POD 2 s/p Cesarean section delivery  Prenatal history: G2P1001   EDC : 03/10/2019, by Other Basis  Prenatal care at [facility]  Primary provider : Pamala Hurry Prenatal course complicated by postdates  Prenatal Labs: ABO, Rh: --/--/O POS, O POS Performed at [facility], [place] [address], [place], [place] [address]  [phone] 1552) Antibody: NEG (10/06 1552) Rubella: Immune (03/13 0000)   RPR: NON REACTIVE (10/06 1552)  HBsAg: Negative (03/13 0000)  HIV: Non-reactive (03/13 0000)  GBS: Positive/-- (03/13 0000)                                    Hospital course:  Induction of Labor With Cesarean Section  33 y.o. yo G2P1001 at [redacted]w[redacted]d was admitted to the hospital 03/15/2019 for induction of labor. Patient had a labor course significant for arrest of fetal descent with NRFHR. The patient went for cesarean section due to Arrest of Descent and Non-Reassuring FHR, and delivered a Viable infant,03/16/2019  Membrane Rupture Time/Date: 8:53 PM ,03/15/2019   Details of operation can be found in separate operative Note.  Patient had an uncomplicated postpartum course. She is ambulating, tolerating a regular diet, passing flatus, and urinating well.  Patient is discharged home in stable condition on 03/18/19.                                   Delivering PROVIDER: Aloha Gell                                                            Complications: None  Newborn Data: Live born female  Birth Weight: 8 lb 11.3 oz (3950 g) APGAR: 76, 9  Newborn Delivery   Birth date/time: 03/16/2019 01:34:00 Delivery type: C-Section, Low Vertical C-section  categorization: Primary      Baby Feeding: Breast Disposition:home with mother  Post partum procedures:none  Labs: Lab Results  Component Value Date   WBC 12.7 (H) 03/16/2019   HGB 10.6 (L) 03/16/2019   HCT 29.4 (L) 03/16/2019   MCV 93.9 03/16/2019   PLT 144 (L) 03/16/2019   CMP Latest Ref Rng & Units 01/12/2009  BUN 6 - 23 mg/dL 12  Creatinine 0.4 - 1.2 mg/dL 0.7    Physical Exam @ time of discharge:  Vitals:   03/17/19 2146 03/18/19 0631 03/18/19 1400 03/18/19 1502  BP: 105/67 (!) 88/55 96/60   Pulse: 86 77 65   Resp: 20 18 18    Temp: 99.6 F (37.6 C) 97.7 F (36.5 C) 98.3 F (36.8 C) 97.9 F (36.6 C)  TempSrc: Oral  Oral   SpO2: 98% 98%    Weight:      Height:       general: alert, cooperative and no distress lochia: appropriate uterine fundus: firm perineum: intact incision: Healing well with no  significant drainage extremities: DVT Evaluation: No evidence of DVT seen on physical exam.  Discharge instructions:  "Baby and Me Booklet" and [place] Discharge Medications:  Allergies as of 03/18/2019   No Known Allergies     Medication List    TAKE these medications   ibuprofen 800 MG tablet Commonly known as: ADVIL Take 1 tablet (800 mg total) by mouth every 8 (eight) hours. What changed:   medication strength  how much to take  when to take this   iron polysaccharides 150 MG capsule Commonly known as: NIFEREX Take 1 capsule (150 mg total) by mouth daily. Start taking on: March 19, 2019   magnesium oxide 400 (241.3 Mg) MG tablet Commonly known as: MAG-OX Take 1 tablet (400 mg total) by mouth daily. Start taking on: March 19, 2019   oxyCODONE 5 MG immediate release tablet Commonly known as: Oxy IR/ROXICODONE Take 1 tablet (5 mg total) by mouth every 4 (four) hours as needed for up to 5 days for severe pain.   prenatal multivitamin Tabs tablet Take 1 tablet daily at 12 noon by mouth.            Discharge Care Instructions   (From admission, onward)         Start     Ordered   03/18/19 0000  Discharge wound care:    Comments: Leave honeycomb in place for 5 days - remove if get wet in shower. Leave steri-strips in place x 2 weeks. Keep incision clean and dry   03/18/19 1620         Diet: routine diet Activity: Advance as tolerated. Pelvic rest x 6 weeks.  Follow up:6 weeks  Signed: Artelia Laroche CNM, MSN, [employer] 03/18/2019, 4:20 PM

## 2019-03-21 MED FILL — PRENATAL VITAMIN PLUS LOW I: 27-1 | 90 days supply | Qty: 90 | Fill #2

## 2019-06-15 MED FILL — PRENATAL FE 27-1 MG TAB: 27-1 | 90 days supply | Qty: 90 | Fill #0

## 2019-09-05 DIAGNOSIS — F4320 Adjustment disorder, unspecified: Secondary | ICD-10-CM | POA: Diagnosis not present

## 2019-09-14 DIAGNOSIS — F4320 Adjustment disorder, unspecified: Secondary | ICD-10-CM | POA: Diagnosis not present

## 2019-09-22 DIAGNOSIS — F4320 Adjustment disorder, unspecified: Secondary | ICD-10-CM | POA: Diagnosis not present

## 2019-09-28 DIAGNOSIS — F4320 Adjustment disorder, unspecified: Secondary | ICD-10-CM | POA: Diagnosis not present

## 2019-10-05 DIAGNOSIS — F4320 Adjustment disorder, unspecified: Secondary | ICD-10-CM | POA: Diagnosis not present

## 2019-10-19 DIAGNOSIS — F4320 Adjustment disorder, unspecified: Secondary | ICD-10-CM | POA: Diagnosis not present

## 2019-10-28 DIAGNOSIS — F4320 Adjustment disorder, unspecified: Secondary | ICD-10-CM | POA: Diagnosis not present

## 2019-11-04 DIAGNOSIS — F4320 Adjustment disorder, unspecified: Secondary | ICD-10-CM | POA: Diagnosis not present

## 2019-11-10 DIAGNOSIS — F4320 Adjustment disorder, unspecified: Secondary | ICD-10-CM | POA: Diagnosis not present

## 2019-11-17 DIAGNOSIS — F4320 Adjustment disorder, unspecified: Secondary | ICD-10-CM | POA: Diagnosis not present

## 2019-11-26 DIAGNOSIS — J019 Acute sinusitis, unspecified: Secondary | ICD-10-CM | POA: Diagnosis not present

## 2019-11-26 DIAGNOSIS — J029 Acute pharyngitis, unspecified: Secondary | ICD-10-CM | POA: Diagnosis not present

## 2019-11-30 DIAGNOSIS — F4320 Adjustment disorder, unspecified: Secondary | ICD-10-CM | POA: Diagnosis not present

## 2019-12-06 DIAGNOSIS — Z1151 Encounter for screening for human papillomavirus (HPV): Secondary | ICD-10-CM | POA: Diagnosis not present

## 2019-12-06 DIAGNOSIS — Z6826 Body mass index (BMI) 26.0-26.9, adult: Secondary | ICD-10-CM | POA: Diagnosis not present

## 2019-12-06 DIAGNOSIS — Z01419 Encounter for gynecological examination (general) (routine) without abnormal findings: Secondary | ICD-10-CM | POA: Diagnosis not present

## 2019-12-07 DIAGNOSIS — F4320 Adjustment disorder, unspecified: Secondary | ICD-10-CM | POA: Diagnosis not present

## 2019-12-08 LAB — HM PAP SMEAR

## 2019-12-14 DIAGNOSIS — F4320 Adjustment disorder, unspecified: Secondary | ICD-10-CM | POA: Diagnosis not present

## 2020-01-02 DIAGNOSIS — F4320 Adjustment disorder, unspecified: Secondary | ICD-10-CM | POA: Diagnosis not present

## 2020-01-26 DIAGNOSIS — F4320 Adjustment disorder, unspecified: Secondary | ICD-10-CM | POA: Diagnosis not present

## 2020-02-02 DIAGNOSIS — F4320 Adjustment disorder, unspecified: Secondary | ICD-10-CM | POA: Diagnosis not present

## 2020-02-17 DIAGNOSIS — F4320 Adjustment disorder, unspecified: Secondary | ICD-10-CM | POA: Diagnosis not present

## 2020-03-06 DIAGNOSIS — F4320 Adjustment disorder, unspecified: Secondary | ICD-10-CM | POA: Diagnosis not present

## 2020-03-30 DIAGNOSIS — F4320 Adjustment disorder, unspecified: Secondary | ICD-10-CM | POA: Diagnosis not present

## 2020-04-27 DIAGNOSIS — F4320 Adjustment disorder, unspecified: Secondary | ICD-10-CM | POA: Diagnosis not present

## 2020-05-24 DIAGNOSIS — F4320 Adjustment disorder, unspecified: Secondary | ICD-10-CM | POA: Diagnosis not present

## 2020-05-30 DIAGNOSIS — J029 Acute pharyngitis, unspecified: Secondary | ICD-10-CM | POA: Diagnosis not present

## 2020-06-04 DIAGNOSIS — F4320 Adjustment disorder, unspecified: Secondary | ICD-10-CM | POA: Diagnosis not present

## 2020-06-19 ENCOUNTER — Other Ambulatory Visit: Payer: Self-pay

## 2020-06-19 ENCOUNTER — Ambulatory Visit (INDEPENDENT_AMBULATORY_CARE_PROVIDER_SITE_OTHER): Payer: No Typology Code available for payment source | Admitting: Internal Medicine

## 2020-06-19 ENCOUNTER — Encounter: Payer: Self-pay | Admitting: Internal Medicine

## 2020-06-19 ENCOUNTER — Ambulatory Visit (INDEPENDENT_AMBULATORY_CARE_PROVIDER_SITE_OTHER): Payer: No Typology Code available for payment source

## 2020-06-19 VITALS — BP 106/68 | HR 65 | Temp 97.9°F | Ht 68.0 in | Wt 165.0 lb

## 2020-06-19 DIAGNOSIS — Z0000 Encounter for general adult medical examination without abnormal findings: Secondary | ICD-10-CM | POA: Diagnosis not present

## 2020-06-19 LAB — CBC
HCT: 37.3 % (ref 36.0–46.0)
Hemoglobin: 12.6 g/dL (ref 12.0–15.0)
MCHC: 33.7 g/dL (ref 30.0–36.0)
MCV: 89.8 fl (ref 78.0–100.0)
Platelets: 203 10*3/uL (ref 150.0–400.0)
RBC: 4.15 Mil/uL (ref 3.87–5.11)
RDW: 13.7 % (ref 11.5–15.5)
WBC: 6.1 10*3/uL (ref 4.0–10.5)

## 2020-06-19 LAB — COMPREHENSIVE METABOLIC PANEL
ALT: 12 U/L (ref 0–35)
AST: 18 U/L (ref 0–37)
Albumin: 4.7 g/dL (ref 3.5–5.2)
Alkaline Phosphatase: 50 U/L (ref 39–117)
BUN: 13 mg/dL (ref 6–23)
CO2: 28 mEq/L (ref 19–32)
Calcium: 9.5 mg/dL (ref 8.4–10.5)
Chloride: 105 mEq/L (ref 96–112)
Creatinine, Ser: 0.76 mg/dL (ref 0.40–1.20)
GFR: 100.83 mL/min (ref >60.00)
Glucose, Bld: 82 mg/dL (ref 70–99)
Potassium: 4.4 mEq/L (ref 3.5–5.1)
Sodium: 138 mEq/L (ref 135–145)
Total Bilirubin: 1 mg/dL (ref 0.2–1.2)
Total Protein: 7.4 g/dL (ref 6.0–8.3)

## 2020-06-19 LAB — LIPID PANEL
Cholesterol: 140 mg/dL (ref 0–200)
HDL: 82.1 mg/dL (ref >39.00)
LDL Cholesterol: 51 mg/dL (ref 0–99)
NonHDL: 57.83
Total CHOL/HDL Ratio: 2
Triglycerides: 35 mg/dL (ref 0.0–149.0)
VLDL: 7 mg/dL (ref 0.0–40.0)

## 2020-06-19 NOTE — Patient Instructions (Signed)
Health Maintenance, Female Adopting a healthy lifestyle and getting preventive care are important in promoting health and wellness. Ask your health care provider about:  The right schedule for you to have regular tests and exams.  Things you can do on your own to prevent diseases and keep yourself healthy. What should I know about diet, weight, and exercise? Eat a healthy diet  Eat a diet that includes plenty of vegetables, fruits, low-fat dairy products, and lean protein.  Do not eat a lot of foods that are high in solid fats, added sugars, or sodium.   Maintain a healthy weight Body mass index (BMI) is used to identify weight problems. It estimates body fat based on height and weight. Your health care provider can help determine your BMI and help you achieve or maintain a healthy weight. Get regular exercise Get regular exercise. This is one of the most important things you can do for your health. Most adults should:  Exercise for at least 150 minutes each week. The exercise should increase your heart rate and make you sweat (moderate-intensity exercise).  Do strengthening exercises at least twice a week. This is in addition to the moderate-intensity exercise.  Spend less time sitting. Even light physical activity can be beneficial. Watch cholesterol and blood lipids Have your blood tested for lipids and cholesterol at 37 years of age, then have this test every 5 years. Have your cholesterol levels checked more often if:  Your lipid or cholesterol levels are high.  You are older than 37 years of age.  You are at high risk for heart disease. What should I know about cancer screening? Depending on your health history and family history, you may need to have cancer screening at various ages. This may include screening for:  Breast cancer.  Cervical cancer.  Colorectal cancer.  Skin cancer.  Lung cancer. What should I know about heart disease, diabetes, and high blood  pressure? Blood pressure and heart disease  High blood pressure causes heart disease and increases the risk of stroke. This is more likely to develop in people who have high blood pressure readings, are of African descent, or are overweight.  Have your blood pressure checked: ? Every 3-5 years if you are 18-39 years of age. ? Every year if you are 40 years old or older. Diabetes Have regular diabetes screenings. This checks your fasting blood sugar level. Have the screening done:  Once every three years after age 40 if you are at a normal weight and have a low risk for diabetes.  More often and at a younger age if you are overweight or have a high risk for diabetes. What should I know about preventing infection? Hepatitis B If you have a higher risk for hepatitis B, you should be screened for this virus. Talk with your health care provider to find out if you are at risk for hepatitis B infection. Hepatitis C Testing is recommended for:  Everyone born from 1945 through 1965.  Anyone with known risk factors for hepatitis C. Sexually transmitted infections (STIs)  Get screened for STIs, including gonorrhea and chlamydia, if: ? You are sexually active and are younger than 37 years of age. ? You are older than 37 years of age and your health care provider tells you that you are at risk for this type of infection. ? Your sexual activity has changed since you were last screened, and you are at increased risk for chlamydia or gonorrhea. Ask your health care provider   if you are at risk.  Ask your health care provider about whether you are at high risk for HIV. Your health care provider may recommend a prescription medicine to help prevent HIV infection. If you choose to take medicine to prevent HIV, you should first get tested for HIV. You should then be tested every 3 months for as long as you are taking the medicine. Pregnancy  If you are about to stop having your period (premenopausal) and  you may become pregnant, seek counseling before you get pregnant.  Take 400 to 800 micrograms (mcg) of folic acid every day if you become pregnant.  Ask for birth control (contraception) if you want to prevent pregnancy. Osteoporosis and menopause Osteoporosis is a disease in which the bones lose minerals and strength with aging. This can result in bone fractures. If you are 65 years old or older, or if you are at risk for osteoporosis and fractures, ask your health care provider if you should:  Be screened for bone loss.  Take a calcium or vitamin D supplement to lower your risk of fractures.  Be given hormone replacement therapy (HRT) to treat symptoms of menopause. Follow these instructions at home: Lifestyle  Do not use any products that contain nicotine or tobacco, such as cigarettes, e-cigarettes, and chewing tobacco. If you need help quitting, ask your health care provider.  Do not use street drugs.  Do not share needles.  Ask your health care provider for help if you need support or information about quitting drugs. Alcohol use  Do not drink alcohol if: ? Your health care provider tells you not to drink. ? You are pregnant, may be pregnant, or are planning to become pregnant.  If you drink alcohol: ? Limit how much you use to 0-1 drink a day. ? Limit intake if you are breastfeeding.  Be aware of how much alcohol is in your drink. In the [place] one drink equals one 12 oz bottle of beer (355 mL), one 5 oz glass of wine (148 mL), or one 1 oz glass of hard liquor (44 mL). General instructions  Schedule regular health, dental, and eye exams.  Stay current with your vaccines.  Tell your health care provider if: ? You often feel depressed. ? You have ever been abused or do not feel safe at home. Summary  Adopting a healthy lifestyle and getting preventive care are important in promoting health and wellness.  Follow your health care provider's instructions about healthy  diet, exercising, and getting tested or screened for diseases.  Follow your health care provider's instructions on monitoring your cholesterol and blood pressure. This information is not intended to replace advice given to you by your health care provider. Make sure you discuss any questions you have with your health care provider. Document Revised: 05/19/2018 Document Reviewed: 05/19/2018 Elsevier Patient Education  2021 Elsevier Inc.  

## 2020-06-19 NOTE — Progress Notes (Signed)
   Subjective:   Patient ID: Kayla Trevino, female    DOB: [birth year], 35 y.o.   MRN: [id number]  HPI The patient is a new 35 YO female coming in for physical.   PMH, [place], social history reviewed and updated  Review of Systems  Constitutional: Negative.   HENT: Negative.   Eyes: Negative.   Respiratory: Negative for cough, chest tightness and shortness of breath.   Cardiovascular: Negative for chest pain, palpitations and leg swelling.  Gastrointestinal: Negative for abdominal distention, abdominal pain, constipation, diarrhea, nausea and vomiting.  Musculoskeletal: Negative.   Skin: Negative.   Neurological: Negative.   Psychiatric/Behavioral: Negative.     Objective:  Physical Exam Constitutional:      Appearance: She is well-developed and well-nourished.  HENT:     Head: Normocephalic and atraumatic.  Eyes:     Extraocular Movements: EOM normal.  Cardiovascular:     Rate and Rhythm: Normal rate and regular rhythm.  Pulmonary:     Effort: Pulmonary effort is normal. No respiratory distress.     Breath sounds: Normal breath sounds. No wheezing or rales.  Abdominal:     General: Bowel sounds are normal. There is no distension.     Palpations: Abdomen is soft.     Tenderness: There is no abdominal tenderness. There is no rebound.  Musculoskeletal:        General: No edema.     Cervical back: Normal range of motion.  Skin:    General: Skin is warm and dry.  Neurological:     Mental Status: She is alert and oriented to person, place, and time.     Coordination: Coordination normal.  Psychiatric:        Mood and Affect: Mood and affect normal.     Vitals:   06/19/20 1101  BP: 106/68  Pulse: 65  Temp: 97.9 F (36.6 C)  TempSrc: Oral  SpO2: 99%  Weight: 165 lb (74.8 kg)  Height: 5\' 8"  (1.727 m)    This visit occurred during the SARS-CoV-2 public health emergency.  Safety protocols were in place, including screening questions prior to the visit, additional usage  of staff PPE, and extensive cleaning of exam room while observing appropriate contact time as indicated for disinfecting solutions.   Assessment & Plan:

## 2020-06-19 NOTE — Assessment & Plan Note (Signed)
Flu shot up to date. Covid-19 up to date including booster. Tetanus up to date. Pap smear up to date with gyn. Counseled about sun safety and mole surveillance. Counseled about the dangers of distracted driving. Given 10 year screening recommendations.

## 2020-08-20 ENCOUNTER — Other Ambulatory Visit (HOSPITAL_COMMUNITY): Payer: Self-pay | Admitting: Internal Medicine

## 2020-08-20 ENCOUNTER — Other Ambulatory Visit: Payer: Self-pay

## 2020-08-20 ENCOUNTER — Telehealth (INDEPENDENT_AMBULATORY_CARE_PROVIDER_SITE_OTHER): Payer: No Typology Code available for payment source | Admitting: Internal Medicine

## 2020-08-20 ENCOUNTER — Encounter: Payer: Self-pay | Admitting: Internal Medicine

## 2020-08-20 DIAGNOSIS — J020 Streptococcal pharyngitis: Secondary | ICD-10-CM | POA: Diagnosis not present

## 2020-08-20 MED ORDER — LIDOCAINE VISCOUS HCL 2 % MT SOLN: 15.0000 mL | OROMUCOSAL | 0 refills | Status: DC | PRN

## 2020-08-20 MED ORDER — AMOXICILLIN-POT CLAVULANATE 875-125 MG PO TABS: 1.0000 | ORAL_TABLET | Freq: Two times a day (BID) | ORAL | 0 refills | Status: AC

## 2020-08-20 NOTE — Progress Notes (Signed)
Virtual Visit via Video Note  I connected with Kayla Trevino on 08/20/20 at  2:20 PM EDT by a video enabled telemedicine application and verified that I am speaking with the correct person using two identifiers.  The patient and the provider were at separate locations throughout the entire encounter. Patient location: home, Provider location: work   I discussed the limitations of evaluation and management by telemedicine and the availability of in person appointments. The patient expressed understanding and agreed to proceed. The patient and the provider were the only parties present for the visit unless noted in HPI below.  History of Present Illness: The patient is a 35 y.o. female with visit for sore throat and fever. Started 2-3 days ago. Has fevers and sore throat. Minimal ear pain. Denies cough or SOB. Denies known sick contacts, kids sick last week not with same. Home covid-19 negative and is vaccinated. Overall it is not improving. Has tried tylenol for pain and fever with some relief.  Observations/Objective: Appearance: normal, breathing appears normal, casual grooming, abdomen does not appear distended, throat red with swollen tonsils, mental status is A and O times 3  Assessment and Plan: See problem oriented charting  Follow Up Instructions: rx augmentin and lidocaine viscous  I discussed the assessment and treatment plan with the patient. The patient was provided an opportunity to ask questions and all were answered. The patient agreed with the plan and demonstrated an understanding of the instructions.   The patient was advised to call back or seek an in-person evaluation if the symptoms worsen or if the condition fails to improve as anticipated.  Hoyt Koch, MD

## 2020-08-20 NOTE — Assessment & Plan Note (Signed)
Rx augmentin and lidocaine viscous.

## 2020-09-13 ENCOUNTER — Encounter: Payer: Self-pay | Admitting: Internal Medicine

## 2020-09-13 DIAGNOSIS — R21 Rash and other nonspecific skin eruption: Secondary | ICD-10-CM

## 2021-08-26 ENCOUNTER — Telehealth: Payer: 59 | Admitting: Emergency Medicine

## 2021-08-26 ENCOUNTER — Other Ambulatory Visit (HOSPITAL_COMMUNITY): Payer: Self-pay

## 2021-08-26 DIAGNOSIS — J020 Streptococcal pharyngitis: Secondary | ICD-10-CM | POA: Diagnosis not present

## 2021-08-26 MED ORDER — AMOXICILLIN-POT CLAVULANATE 875-125 MG PO TABS
1.0000 | ORAL_TABLET | Freq: Two times a day (BID) | ORAL | 0 refills | Status: DC
  Filled 2021-08-26: qty 20, 10d supply, fill #0

## 2021-08-26 NOTE — Patient Instructions (Signed)
?  Idelia Salm, thank you for joining Carvel Getting, NP for today's virtual visit.  While this provider is not your primary care provider (PCP), if your PCP is located in our provider database this encounter information will be shared with them immediately following your visit. ? ?Consent: ?(Patient) Kayla Trevino provided verbal consent for this virtual visit at the beginning of the encounter. ? ?Current Medications: ? ?Current Outpatient Medications:  ?  amoxicillin-clavulanate (AUGMENTIN) 875-125 MG tablet, Take 1 tablet by mouth 2 (two) times daily., Disp: 20 tablet, Rfl: 0 ?  Multiple Vitamin (MULTIVITAMIN) tablet, Take 1 tablet by mouth daily., Disp: , Rfl:   ? ?Medications ordered in this encounter:  ?Meds ordered this encounter  ?Medications  ? amoxicillin-clavulanate (AUGMENTIN) 875-125 MG tablet  ?  Sig: Take 1 tablet by mouth 2 (two) times daily.  ?  Dispense:  20 tablet  ?  Refill:  0  ?  ? ?*If you need refills on other medications prior to your next appointment, please contact your pharmacy* ? ?Follow-Up: ?Call back or seek an in-person evaluation if the symptoms worsen or if the condition fails to improve as anticipated. ? ?Other Instructions ?I'm sorry I am not able to prescribe an antibiotic for you for less than 10 days - recommendation is 10 days after failure on penicillin.  ? ?Please change your toothbrush 48 hours after starting augmentin.  ? ?You can use ibuprofen or tylenol, salt water gargles, for pain.  ? ? ?If you have been instructed to have an in-person evaluation today at a local Urgent Care facility, please use the link below. It will take you to a list of all of our available [facility], including address, phone number and hours of operation. Please do not delay care.  ?[facility] ? ?If you or a family member do not have a primary care provider, use the link below to schedule a visit and establish care. When you choose a [facility] primary care physician  or advanced practice provider, you gain a long-term partner in health. ?Find a Primary Care Provider ? ?Learn more about [facility]'s in-office and virtual care options: ?[facility]  ?

## 2021-08-26 NOTE — Progress Notes (Signed)
?Virtual Visit Consent  ? ?Kayla Trevino, you are scheduled for a virtual visit with a [facility] provider today.   ?  ?Just as with appointments in the office, your consent must be obtained to participate.  Your consent will be active for this visit and any virtual visit you may have with one of our providers in the next 365 days.   ?  ?If you have a MyChart account, a copy of this consent can be sent to you electronically.  All virtual visits are billed to your insurance company just like a traditional visit in the office.   ? ?As this is a virtual visit, video technology does not allow for your provider to perform a traditional examination.  This may limit your provider's ability to fully assess your condition.  If your provider identifies any concerns that need to be evaluated in person or the need to arrange testing (such as labs, EKG, etc.), we will make arrangements to do so.   ?  ?Although advances in technology are sophisticated, we cannot ensure that it will always work on either your end or our end.  If the connection with a video visit is poor, the visit may have to be switched to a telephone visit.  With either a video or telephone visit, we are not always able to ensure that we have a secure connection.    ? ?I need to obtain your verbal consent now.   Are you willing to proceed with your visit today?  ?  ?Kayla Trevino has provided verbal consent on 08/26/2021 for a virtual visit (video or telephone). ?  ?Carvel Getting, NP  ? ?Date: 08/26/2021 10:24 AM ? ? ?Virtual Visit via Video Note  ? ?Kayla Trevino, connected with  Kayla Trevino  ([id number], 12-21-1985) on 08/26/21 at 10:15 AM EDT by a video-enabled telemedicine application and verified that I am speaking with the correct person using two identifiers. ? ?Location: ?Patient: Virtual Visit Location Patient: Home ?Provider: Virtual Visit Location Provider: Home Office ?  ?I discussed the limitations of evaluation and management by telemedicine and  the availability of in person appointments. The patient expressed understanding and agreed to proceed.   ? ?History of Present Illness: ?Kayla Trevino is a 36 y.o. who identifies as a female who was assigned female at birth, and is being seen today for sore throat. She had strep throat 2.5 weeks ago or more, was treated with penicillin for 10 days, felt she got completely better. She then developed a cold and congestion from which she has also now completely recovered. Yesterday she developed a sore throat again with no other symptoms other than feeling tired (no congestion, no fever). Her latest sore throat is B and feels like strep again. She reports "pus pockets" on her R tonsil like when she had strep. She did not change her toothbrush after completing penicillin.  ? ?HPI: HPI  ?Problems:  ?Patient Active Problem List  ? Diagnosis Date Noted  ? Strep pharyngitis 08/20/2020  ? Routine general medical examination at a health care facility 06/19/2020  ?  ?Allergies: No Known Allergies ?Medications:  ?Current Outpatient Medications:  ?  amoxicillin-clavulanate (AUGMENTIN) 875-125 MG tablet, Take 1 tablet by mouth 2 (two) times daily., Disp: 20 tablet, Rfl: 0 ?  Multiple Vitamin (MULTIVITAMIN) tablet, Take 1 tablet by mouth daily., Disp: , Rfl:  ? ?Observations/Objective: ?Patient is well-developed, well-nourished in no acute distress.  ?Resting comfortably  at home.  ?Head is normocephalic,  atraumatic.  ?No labored breathing.  ?Speech is clear and coherent with logical content.  ?Patient is alert and oriented at baseline.  ? ? ?Assessment and Plan: ?1. Strep pharyngitis ? ?I'm worried about strep recurrence, either from lack of total cure from penicillin or from re-infection from toothbrush. Will rx augmentin, pt to get new toothbrush.  ? ?Follow Up Instructions: ?I discussed the assessment and treatment plan with the patient. The patient was provided an opportunity to ask questions and all were answered. The patient  agreed with the plan and demonstrated an understanding of the instructions.  A copy of instructions were sent to the patient via MyChart unless otherwise noted below.  ? ?The patient was advised to call back or seek an in-person evaluation if the symptoms worsen or if the condition fails to improve as anticipated. ? ?Time:  ?I spent 10 minutes with the patient via telehealth technology discussing the above problems/concerns.   ? ?Carvel Getting, NP ?

## 2021-08-27 ENCOUNTER — Telehealth: Payer: No Typology Code available for payment source | Admitting: Internal Medicine

## 2021-09-11 ENCOUNTER — Encounter: Payer: 59 | Admitting: Internal Medicine

## 2021-09-17 ENCOUNTER — Encounter: Payer: Self-pay | Admitting: Internal Medicine

## 2021-09-17 ENCOUNTER — Ambulatory Visit (INDEPENDENT_AMBULATORY_CARE_PROVIDER_SITE_OTHER): Payer: 59 | Admitting: Internal Medicine

## 2021-09-17 VITALS — BP 114/62 | HR 59 | Resp 18 | Ht 68.0 in | Wt 178.4 lb

## 2021-09-17 DIAGNOSIS — Z0000 Encounter for general adult medical examination without abnormal findings: Secondary | ICD-10-CM

## 2021-09-17 NOTE — Progress Notes (Signed)
? ?  Subjective:  ? ?Patient ID: Kayla Trevino, female    DOB: 1986-03-25, 36 y.o.   MRN: [id number] ? ?HPI ?The patient is a 36 YO coming in for physical.  ? ?PMH, [facility], social history reviewed and updated ? ?Review of Systems  ?Constitutional: Negative.   ?HENT: Negative.    ?Eyes: Negative.   ?Respiratory:  Negative for cough, chest tightness and shortness of breath.   ?Cardiovascular:  Negative for chest pain, palpitations and leg swelling.  ?Gastrointestinal:  Negative for abdominal distention, abdominal pain, constipation, diarrhea, nausea and vomiting.  ?Musculoskeletal: Negative.   ?Skin: Negative.   ?Neurological: Negative.   ?Psychiatric/Behavioral: Negative.    ? ?Objective:  ?Physical Exam ?Constitutional:   ?   Appearance: She is well-developed.  ?HENT:  ?   Head: Normocephalic and atraumatic.  ?Cardiovascular:  ?   Rate and Rhythm: Normal rate and regular rhythm.  ?Pulmonary:  ?   Effort: Pulmonary effort is normal. No respiratory distress.  ?   Breath sounds: Normal breath sounds. No wheezing or rales.  ?Abdominal:  ?   General: Bowel sounds are normal. There is no distension.  ?   Palpations: Abdomen is soft.  ?   Tenderness: There is no abdominal tenderness. There is no rebound.  ?Musculoskeletal:  ?   Cervical back: Normal range of motion.  ?Skin: ?   General: Skin is warm and dry.  ?Neurological:  ?   Mental Status: She is alert and oriented to person, place, and time.  ?   Coordination: Coordination normal.  ? ? ?Vitals:  ? 09/17/21 1415  ?BP: 114/62  ?Pulse: (!) 59  ?Resp: 18  ?SpO2: 100%  ?Weight: 178 lb 6.4 oz (80.9 kg)  ?Height: '5\' 8"'$  (1.727 m)  ? ? ?This visit occurred during the SARS-CoV-2 public health emergency.  Safety protocols were in place, including screening questions prior to the visit, additional usage of staff PPE, and extensive cleaning of exam room while observing appropriate contact time as indicated for disinfecting solutions.  ? ?Assessment & Plan:  ? ?

## 2021-09-18 NOTE — Assessment & Plan Note (Signed)
Flu shot up to date. Covid-19 counseled. Tetanus up to date. Pap smear last done 2021 due 2024 plan to do this with next physical. Counseled about sun safety and mole surveillance. Counseled about the dangers of distracted driving. Given 10 year screening recommendations.  ? ?

## 2021-11-05 ENCOUNTER — Encounter: Payer: Self-pay | Admitting: Internal Medicine

## 2022-01-13 ENCOUNTER — Telehealth: Payer: 59 | Admitting: Physician Assistant

## 2022-01-13 ENCOUNTER — Other Ambulatory Visit (HOSPITAL_COMMUNITY): Payer: Self-pay

## 2022-01-13 DIAGNOSIS — R21 Rash and other nonspecific skin eruption: Secondary | ICD-10-CM

## 2022-01-13 MED ORDER — PREDNISONE 10 MG (21) PO TBPK
ORAL_TABLET | ORAL | 0 refills | Status: DC
  Filled 2022-01-13: qty 21, 6d supply, fill #0

## 2022-01-13 NOTE — Progress Notes (Signed)
E Visit for Rash  We are sorry that you are not feeling well. Here is how we plan to help!  Based on what you shared with me it looks like you have contact dermatitis.  Contact dermatitis is a skin rash caused by something that touches the skin and causes irritation or inflammation.  Your skin may be red, swollen, dry, cracked, and itch.  The rash should go away in a few days but can last a few weeks.  If you get a rash, it's important to figure out what caused it so the irritant can be avoided in the future.  Prednisone 10 mg daily for 6 days (see taper instructions below)  Directions for 6 day taper: Day 1: 2 tablets before breakfast, 1 after both lunch & dinner and 2 at bedtime Day 2: 1 tab before breakfast, 1 after both lunch & dinner and 2 at bedtime Day 3: 1 tab at each meal & 1 at bedtime Day 4: 1 tab at breakfast, 1 at lunch, 1 at bedtime Day 5: 1 tab at breakfast & 1 tab at bedtime Day 6: 1 tab at breakfast    HOME CARE:  Take cool showers and avoid direct sunlight. Apply cool compress or wet dressings. Take a bath in an oatmeal bath.  Sprinkle content of one Aveeno packet under running faucet with comfortably warm water.  Bathe for 15-20 minutes, 1-2 times daily.  Pat dry with a towel. Do not rub the rash. Use hydrocortisone cream. Take an antihistamine like Benadryl for widespread rashes that itch.  The adult dose of Benadryl is 25-50 mg by mouth 4 times daily. Caution:  This type of medication may cause sleepiness.  Do not drink alcohol, drive, or operate dangerous machinery while taking antihistamines.  Do not take these medications if you have prostate enlargement.  Read package instructions thoroughly on all medications that you take.  GET HELP RIGHT AWAY IF:  Symptoms don't go away after treatment. Severe itching that persists. If you rash spreads or swells. If you rash begins to smell. If it blisters and opens or develops a yellow-brown crust. You develop a  fever. You have a sore throat. You become short of breath.  MAKE SURE YOU:  Understand these instructions. Will watch your condition. Will get help right away if you are not doing well or get worse.  Thank you for choosing an e-visit.  Your e-visit answers were reviewed by a board certified advanced clinical practitioner to complete your personal care plan. Depending upon the condition, your plan could have included both over the counter or prescription medications.  Please review your pharmacy choice. Make sure the pharmacy is open so you can pick up prescription now. If there is a problem, you may contact your provider through [employer] and have the prescription routed to another pharmacy.  Your safety is important to [place]. If you have drug allergies check your prescription carefully.   For the next 24 hours you can use MyChart to ask questions about today's visit, request a non-urgent call back, or ask for a work or school excuse. You will get an email in the next two days asking about your experience. I hope that your e-visit has been valuable and will speed your recovery.  I provided 5 minutes of non face-to-face time during this encounter for chart review and documentation.

## 2022-01-22 ENCOUNTER — Encounter: Payer: Self-pay | Admitting: Internal Medicine

## 2022-01-22 ENCOUNTER — Ambulatory Visit: Payer: 59 | Admitting: Internal Medicine

## 2022-01-22 ENCOUNTER — Other Ambulatory Visit (HOSPITAL_COMMUNITY): Payer: Self-pay

## 2022-01-22 VITALS — BP 106/64 | HR 68 | Temp 98.2°F | Ht 68.0 in | Wt 178.0 lb

## 2022-01-22 DIAGNOSIS — L42 Pityriasis rosea: Secondary | ICD-10-CM | POA: Insufficient documentation

## 2022-01-22 MED ORDER — CLOBETASOL PROPIONATE 0.05 % EX OINT
1.0000 | TOPICAL_OINTMENT | Freq: Two times a day (BID) | CUTANEOUS | 0 refills | Status: DC
  Filled 2022-01-22: qty 45, 30d supply, fill #0

## 2022-01-22 MED ORDER — LEVOCETIRIZINE DIHYDROCHLORIDE 5 MG PO TABS
5.0000 mg | ORAL_TABLET | Freq: Every evening | ORAL | 0 refills | Status: DC
  Filled 2022-01-22: qty 30, 30d supply, fill #0

## 2022-01-22 MED ORDER — PREDNISONE 20 MG PO TABS
20.0000 mg | ORAL_TABLET | Freq: Two times a day (BID) | ORAL | 0 refills | Status: AC
  Filled 2022-01-22: qty 20, 10d supply, fill #0

## 2022-01-22 NOTE — Progress Notes (Unsigned)
Subjective:  Patient ID: Kayla Trevino, female    DOB: 1985/09/09  Age: 36 y.o. MRN: [id number]  CC: Rash   HPI Kayla Trevino presents for rash -   36 yo female presents today for rash that started on August 7. She was trimming the bushes in her bathing suit and noticed a rash that appeared on her bikini line after using her husband's towel to dry off. She reports that that she tried Lotrimin, cortisone 1% cream and calamine lotion with little relief of itching. She was given a 6 day prednisone taper prescription from an evisit. She states that it seemed to be getting better and then 2 days ago it started itching again. Patient states that it seems to have spread to the torso, neck area and her right finger. The right finger appears as vesicles and the widespread rash appears as ovals in various sizes. Patient states no recent illness, infection, or exposure.   Outpatient Medications Prior to Visit  Medication Sig Dispense Refill   Multiple Vitamin (MULTIVITAMIN) tablet Take 1 tablet by mouth daily.     predniSONE (STERAPRED UNI-PAK 21 TAB) 10 MG (21) TBPK tablet take as directed on package 21 tablet 0   No facility-administered medications prior to visit.    ROS Review of Systems  Constitutional: Negative.  Negative for chills, diaphoresis, fatigue and fever.  HENT: Negative.  Negative for sore throat and trouble swallowing.   Eyes: Negative.  Negative for redness.  Respiratory:  Negative for cough and shortness of breath.   Cardiovascular:  Negative for chest pain, palpitations and leg swelling.  Gastrointestinal:  Negative for abdominal pain, diarrhea, nausea and vomiting.  Endocrine: Negative.   Genitourinary: Negative.  Negative for difficulty urinating.  Musculoskeletal:  Negative for myalgias.  Skin:  Positive for color change and rash.  Allergic/Immunologic: Negative.   Neurological: Negative.  Negative for dizziness.  Hematological:  Negative for  adenopathy. Does not bruise/bleed easily.  Psychiatric/Behavioral: Negative.      Objective:  BP 106/64 (BP Location: Left Arm, Patient Position: Sitting, Cuff Size: Large)   Pulse 68   Temp 98.2 F (36.8 C) (Oral)   Ht '5\' 8"'$  (1.727 m)   Wt 178 lb (80.7 kg)   SpO2 99%   BMI 27.06 kg/m   BP Readings from Last 3 Encounters:  01/22/22 106/64  09/17/21 114/62  06/19/20 106/68    Wt Readings from Last 3 Encounters:  01/22/22 178 lb (80.7 kg)  09/17/21 178 lb 6.4 oz (80.9 kg)  06/19/20 165 lb (74.8 kg)    Physical Exam Vitals reviewed.  HENT:     Mouth/Throat:     Mouth: Mucous membranes are moist.  Eyes:     General: No scleral icterus.    Conjunctiva/sclera: Conjunctivae normal.  Cardiovascular:     Rate and Rhythm: Normal rate and regular rhythm.     Heart sounds: No murmur heard. Pulmonary:     Effort: Pulmonary effort is normal.     Breath sounds: No stridor. No wheezing, rhonchi or rales.  Abdominal:     General: Abdomen is flat.     Palpations: There is no mass.     Tenderness: There is no abdominal tenderness. There is no guarding.  Musculoskeletal:        General: Normal range of motion.     Cervical back: Neck supple.  Lymphadenopathy:     Cervical: No cervical adenopathy.  Skin:  Findings: Rash present.     Comments: There is a lichenified patch on the volar surface of the right forearm.  Across the neck and torso there are erythematous and dusky ovoid macules and papules.  There appears to be a herald patch in the left groin.  Some of these lesions have a collarette of scale.  There are no vesicles or pustules.  Neurological:     General: No focal deficit present.     Mental Status: She is alert.     Lab Results  Component Value Date   WBC 6.1 06/19/2020   HGB 12.6 06/19/2020   HCT 37.3 06/19/2020   PLT 203.0 06/19/2020   GLUCOSE 82 06/19/2020   CHOL 140 06/19/2020   TRIG 35.0 06/19/2020   HDL 82.10 06/19/2020   LDLCALC 51 06/19/2020   ALT  12 06/19/2020   AST 18 06/19/2020   NA 138 06/19/2020   K 4.4 06/19/2020   CL 105 06/19/2020   CREATININE 0.76 06/19/2020   BUN 13 06/19/2020   CO2 28 06/19/2020    No results found.  Assessment & Plan:   Kayla Trevino was seen today for rash.  Diagnoses and all orders for this visit:  Pityriasis rosea -     levocetirizine (XYZAL) 5 MG tablet; Take 1 tablet (5 mg total) by mouth every evening. -     predniSONE (DELTASONE) 20 MG tablet; Take 1 tablet (20 mg total) by mouth 2 (two) times daily with a meal for 10 days. -     clobetasol ointment (TEMOVATE) 0.05 %; Apply 1 Application topically 2 (two) times daily.   I have discontinued Aylssa Fujita's predniSONE. I am also having her start on levocetirizine, predniSONE, and clobetasol ointment. Additionally, I am having her maintain her multivitamin.  Meds ordered this encounter  Medications   levocetirizine (XYZAL) 5 MG tablet    Sig: Take 1 tablet (5 mg total) by mouth every evening.    Dispense:  30 tablet    Refill:  0   predniSONE (DELTASONE) 20 MG tablet    Sig: Take 1 tablet (20 mg total) by mouth 2 (two) times daily with a meal for 10 days.    Dispense:  20 tablet    Refill:  0   clobetasol ointment (TEMOVATE) 0.05 %    Sig: Apply 1 Application topically 2 (two) times daily.    Dispense:  45 g    Refill:  0     Follow-up: Return if symptoms worsen or fail to improve.  Scarlette Calico, MD

## 2022-01-22 NOTE — Patient Instructions (Signed)
Pityriasis Alba Pityriasis alba is a skin condition that causes red or pink scaly patches of skin (pityriasis) that then lose color (alba). This is a temporary condition that commonly affects children between the ages of 27 and 16 years. It cannot spread from one child to another (is not contagious). The skin clears up over time, usually within a few months to 1 year. What are the causes? The cause of this condition is not known. Children with allergies may be at higher risk. What are the signs or symptoms? Symptoms of this condition are a series of skin changes. In the first phase, up to 20 small red or pink skin patches covered with fine scales develop. They may be round or oval and slightly itchy. The patches commonly affect the face and cheeks. Some children also get patches in other areas of the body, usually the arms, shoulders, neck, or trunk. The redness eventually goes away. The patches will still have a scaly surface, but the skin loses color and becomes paler than the usual skin color. Over time, the scaly surface also clears up and leaves only smooth pale or white patches. The smooth patches regain the usual skin color within a month to a year. The condition rarely lasts longer than this. How is this diagnosed? This condition is diagnosed based on: Your child's symptoms and medical history. A physical exam. How is this treated? This condition usually goes away without treatment. However, your child's health care provider may suggest: A thick lotion (emollient cream) to moisturize the skin. A mild steroid cream if your child's skin is itchy during the first phase. A moisturizing cream may be used during the scaly phase. In rare cases, other treatments may be used if the skin patches cover a large area or do not start to go away. These include: Medicated creams. Treatments with ultraviolet light. Follow these instructions at home: Give your child over-the-counter and prescription  medicines only as told by your child's health care provider. Use skin creams or lotions only as told by your child's health care provider. Ask your child's health care provider to recommend a moisturizer and a sunscreen. Dry skin and sun exposure can make this condition worse. Keep all follow-up visits. This is important. Contact a health care provider if: Your child still has signs of this condition after 1 year. The affected areas get worse or do not get better with topical medicine, including creams and ointments. Summary Pityriasis alba is a skin condition that causes red or pink scaly patches of skin (pityriasis) that then lose color (alba). This condition commonly affects children between the ages of 2 and 16 years. This condition cannot spread from one child to another (is not contagious). Your child's health care provider may recommend using a mild steroid cream or a moisturizer to relieve itchiness and dryness. Pityriasis alba almost always clears up without treatment within 1 year. This information is not intended to replace advice given to you by your health care provider. Make sure you discuss any questions you have with your health care provider. Document Revised: 03/05/2020 Document Reviewed: 03/05/2020 Elsevier Patient Education  Kayla Trevino.

## 2022-02-18 ENCOUNTER — Telehealth: Payer: 59 | Admitting: Physician Assistant

## 2022-02-18 DIAGNOSIS — H1033 Unspecified acute conjunctivitis, bilateral: Secondary | ICD-10-CM | POA: Diagnosis not present

## 2022-02-18 MED ORDER — POLYMYXIN B-TRIMETHOPRIM 10000-0.1 UNIT/ML-% OP SOLN
1.0000 [drp] | Freq: Four times a day (QID) | OPHTHALMIC | 0 refills | Status: DC
  Filled 2022-02-18: qty 10, 5d supply, fill #0

## 2022-02-18 NOTE — Progress Notes (Signed)
I have spent 5 minutes in review of e-visit questionnaire, review and updating patient chart, medical decision making and response to patient.   Nagee Goates Cody Ellicia Alix, PA-C    

## 2022-02-18 NOTE — Progress Notes (Signed)

## 2022-02-18 NOTE — Progress Notes (Signed)
Message sent to patient requesting further input regarding current symptoms. Awaiting patient response.  

## 2022-02-19 ENCOUNTER — Other Ambulatory Visit (HOSPITAL_COMMUNITY): Payer: Self-pay

## 2022-03-21 IMAGING — DX DG CHEST 2V
2 series · 2 of 2 positions shown · non-contrast
Comparison: None.

CLINICAL DATA: 34-year-old female, lung cancer screening
radiograph.

EXAM:
CHEST - 2 VIEW

[chest pa]
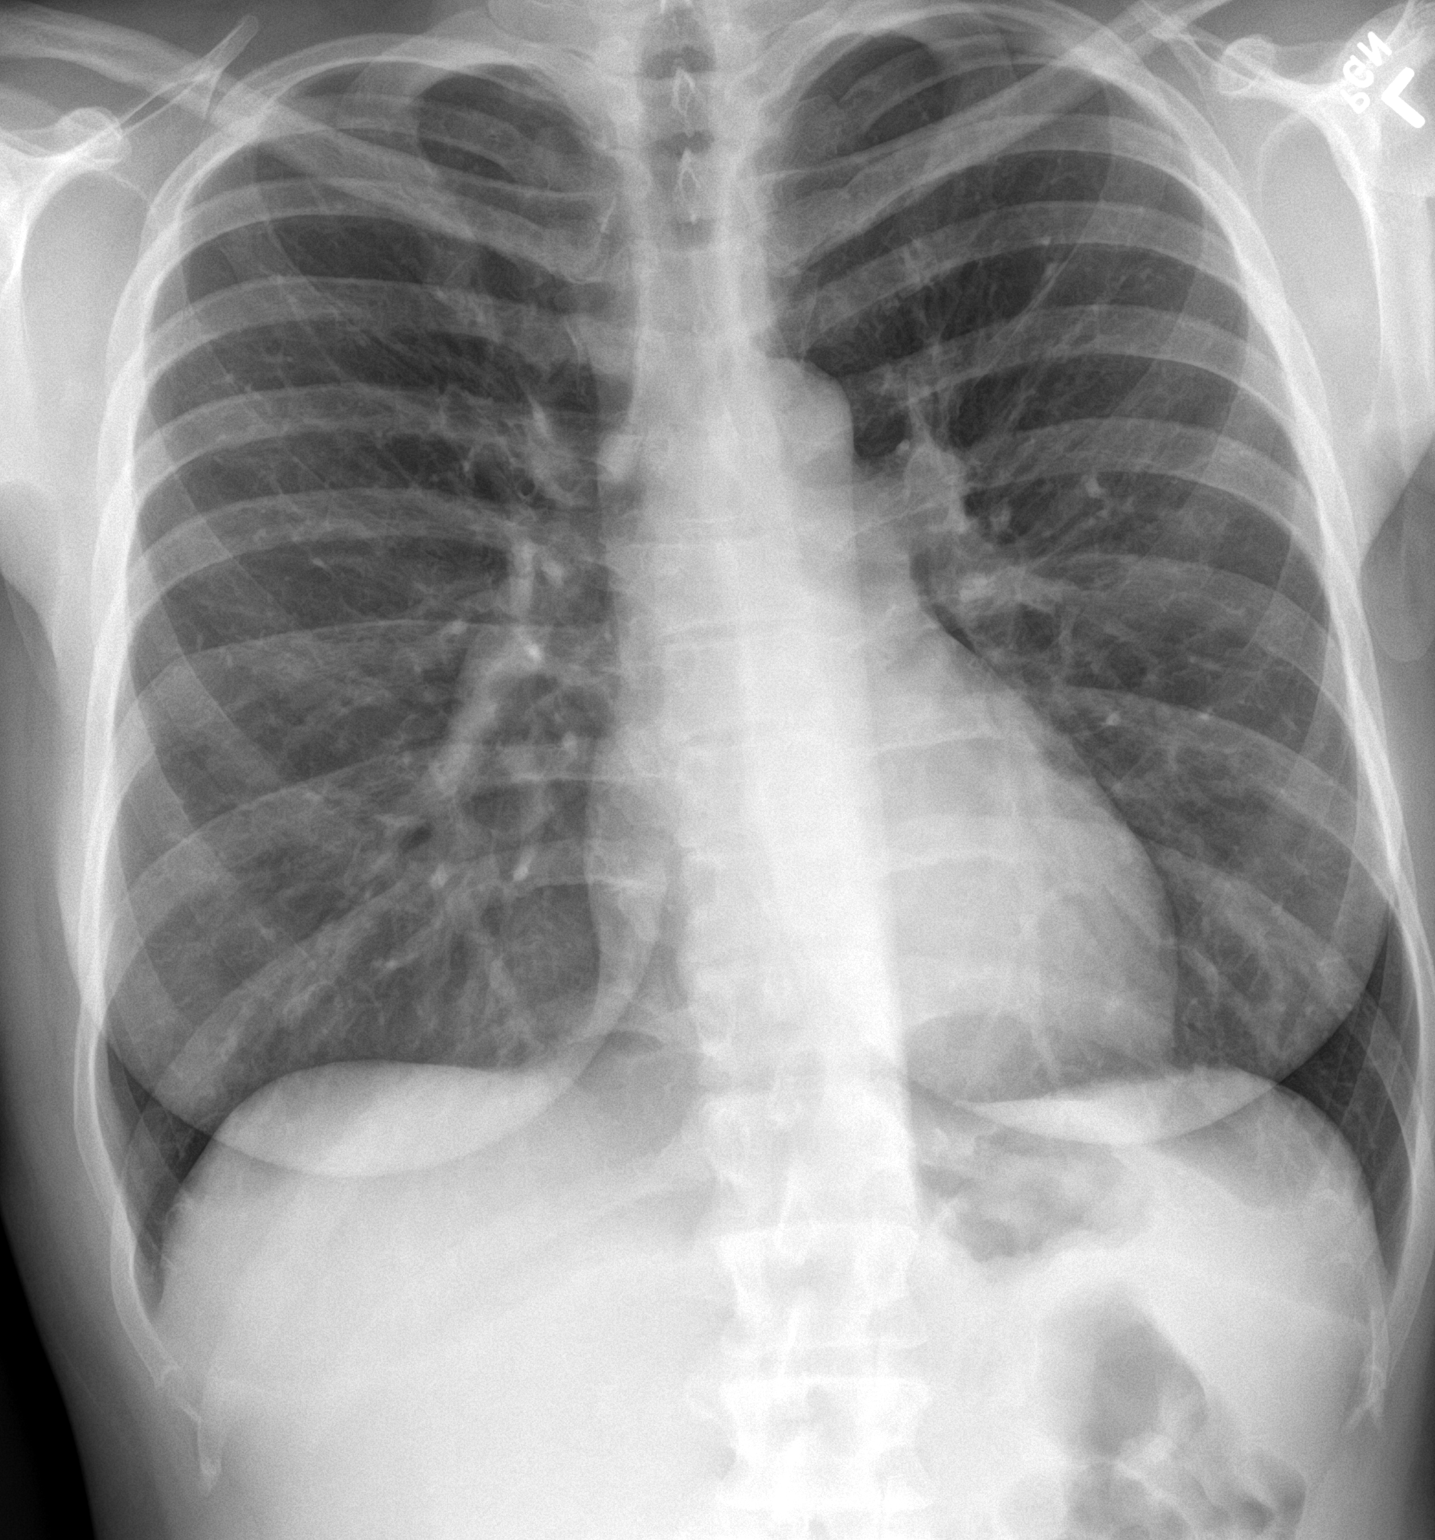

[chest lat]
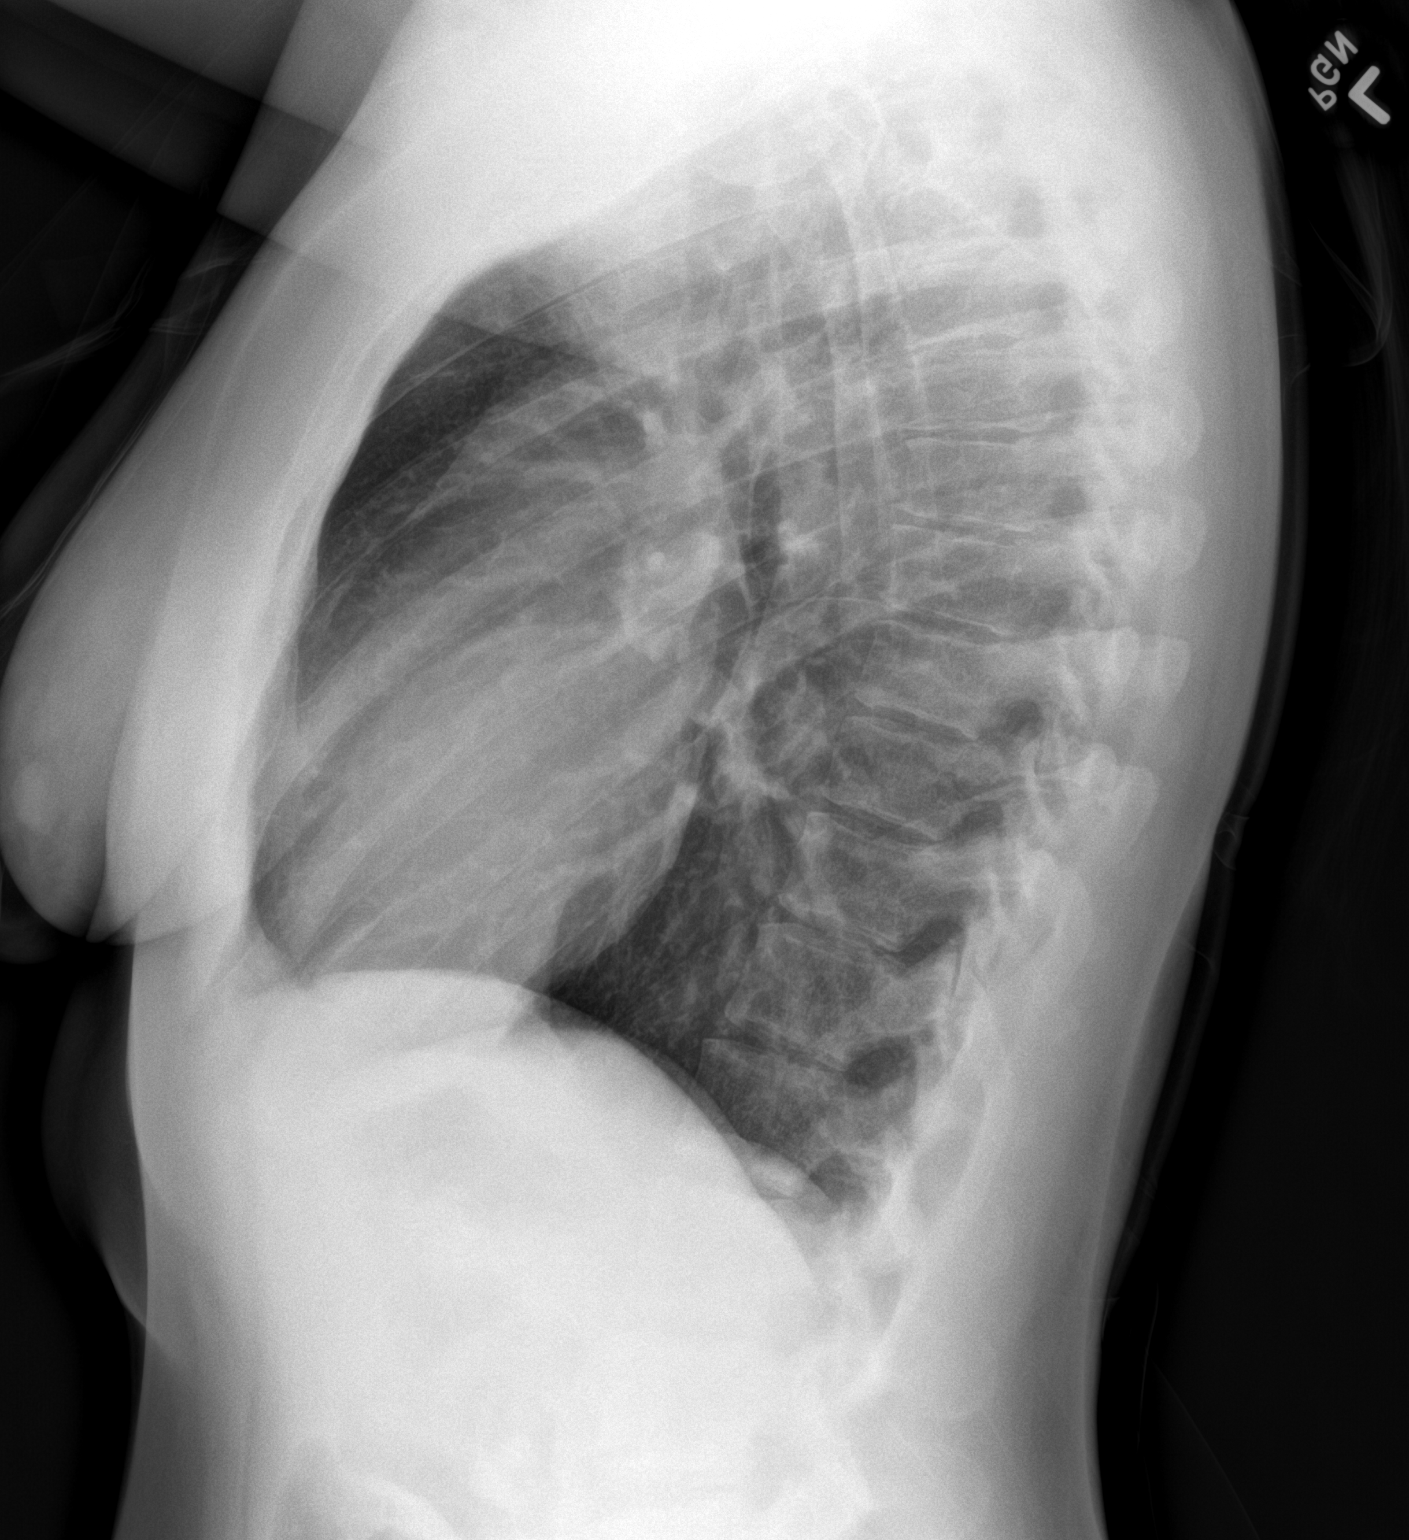

[2 of 2 positions shown; findings below may reference images not displayed]

FINDINGS: The heart size and mediastinal contours are within normal limits.
Both lungs are clear. The visualized skeletal structures are
unremarkable.
IMPRESSION: No acute cardiopulmonary process or radiographic evidence of
pulmonary mass.

## 2022-04-08 ENCOUNTER — Ambulatory Visit: Payer: 59 | Admitting: Family Medicine

## 2022-06-13 ENCOUNTER — Other Ambulatory Visit (HOSPITAL_COMMUNITY): Payer: Self-pay

## 2022-06-13 ENCOUNTER — Telehealth: Payer: 59 | Admitting: Physician Assistant

## 2022-06-13 DIAGNOSIS — R3989 Other symptoms and signs involving the genitourinary system: Secondary | ICD-10-CM | POA: Diagnosis not present

## 2022-06-13 MED ORDER — CEPHALEXIN 500 MG PO CAPS
500.0000 mg | ORAL_CAPSULE | Freq: Two times a day (BID) | ORAL | 0 refills | Status: DC
  Filled 2022-06-13: qty 14, 7d supply, fill #0

## 2022-06-13 NOTE — Progress Notes (Signed)

## 2022-08-06 ENCOUNTER — Telehealth: Payer: 59 | Admitting: Nurse Practitioner

## 2022-08-06 DIAGNOSIS — J020 Streptococcal pharyngitis: Secondary | ICD-10-CM | POA: Diagnosis not present

## 2022-08-07 ENCOUNTER — Other Ambulatory Visit (HOSPITAL_COMMUNITY): Payer: Self-pay

## 2022-08-07 ENCOUNTER — Telehealth: Payer: Commercial Managed Care - PPO | Admitting: Nurse Practitioner

## 2022-08-07 MED ORDER — AZITHROMYCIN 250 MG PO TABS
ORAL_TABLET | ORAL | 0 refills | Status: AC
  Filled 2022-08-07: qty 6, 5d supply, fill #0

## 2022-08-07 NOTE — Progress Notes (Signed)
E-Visit for Sore Throat - Strep Symptoms  We are sorry that you are not feeling well.  Here is how we plan to help!  Based on what you have shared with me it is likely that you have strep pharyngitis.  Strep pharyngitis is inflammation and infection in the back of the throat.  This is an infection cause by bacteria and is treated with antibiotics.  I have prescribed Azithromycin 250 mg two tablets today and then one daily for 4 additional days. For throat pain, we recommend over the counter oral pain relief medications such as acetaminophen or aspirin, or anti-inflammatory medications such as ibuprofen or naproxen sodium. Topical treatments such as oral throat lozenges or sprays may be used as needed. Strep infections are not as easily transmitted as other respiratory infections, however we still recommend that you avoid close contact with loved ones, especially the very young and elderly.  Remember to wash your hands thoroughly throughout the day as this is the number one way to prevent the spread of infection and wipe down door knobs and counters with disinfectant.   Home Care: Only take medications as instructed by your medical team. Complete the entire course of an antibiotic. Do not take these medications with alcohol. A steam or ultrasonic humidifier can help congestion.  You can place a towel over your head and breathe in the steam from hot water coming from a faucet. Avoid close contacts especially the very young and the elderly. Cover your mouth when you cough or sneeze. Always remember to wash your hands.  Get Help Right Away If: You develop worsening fever or sinus pain. You develop a severe head ache or visual changes. Your symptoms persist after you have completed your treatment plan.  Make sure you Understand these instructions. Will watch your condition. Will get help right away if you are not doing well or get worse.   Thank you for choosing an e-visit.  Your e-visit  answers were reviewed by a board certified advanced clinical practitioner to complete your personal care plan. Depending upon the condition, your plan could have included both over the counter or prescription medications.  Please review your pharmacy choice. Make sure the pharmacy is open so you can pick up prescription now. If there is a problem, you may contact your provider through [employer] and have the prescription routed to another pharmacy.  Your safety is important to [place]. If you have drug allergies check your prescription carefully.   For the next 24 hours you can use MyChart to ask questions about today's visit, request a non-urgent call back, or ask for a work or school excuse. You will get an email in the next two days asking about your experience. I hope that your e-visit has been valuable and will speed your recovery.   Meds ordered this encounter  Medications   azithromycin (ZITHROMAX) 250 MG tablet    Sig: Take 2 tablets on day 1, then 1 tablet daily on days 2 through 5    Dispense:  6 tablet    Refill:  0     I spent approximately 5 minutes reviewing the patient's history, current symptoms and coordinating their care today.

## 2022-08-07 NOTE — Progress Notes (Signed)
Duplicate visits submitted

## 2022-09-02 DIAGNOSIS — D225 Melanocytic nevi of trunk: Secondary | ICD-10-CM | POA: Diagnosis not present

## 2022-09-02 DIAGNOSIS — L814 Other melanin hyperpigmentation: Secondary | ICD-10-CM | POA: Diagnosis not present

## 2022-12-02 ENCOUNTER — Ambulatory Visit (INDEPENDENT_AMBULATORY_CARE_PROVIDER_SITE_OTHER): Payer: 59 | Admitting: Internal Medicine

## 2022-12-02 ENCOUNTER — Encounter: Payer: Self-pay | Admitting: Internal Medicine

## 2022-12-02 VITALS — BP 126/80 | HR 52 | Temp 98.2°F | Ht 68.0 in | Wt 181.0 lb

## 2022-12-02 DIAGNOSIS — Z0000 Encounter for general adult medical examination without abnormal findings: Secondary | ICD-10-CM

## 2022-12-02 LAB — LIPID PANEL
Cholesterol: 137 mg/dL (ref 0–200)
HDL: 62.4 mg/dL (ref >39.00)
LDL Cholesterol: 64 mg/dL (ref 0–99)
NonHDL: 74.56
Total CHOL/HDL Ratio: 2
Triglycerides: 53 mg/dL (ref 0.0–149.0)
VLDL: 10.6 mg/dL (ref 0.0–40.0)

## 2022-12-02 LAB — COMPREHENSIVE METABOLIC PANEL
ALT: 13 U/L (ref 0–35)
AST: 19 U/L (ref 0–37)
Albumin: 4.2 g/dL (ref 3.5–5.2)
Alkaline Phosphatase: 53 U/L (ref 39–117)
BUN: 15 mg/dL (ref 6–23)
CO2: 28 mEq/L (ref 19–32)
Calcium: 9.6 mg/dL (ref 8.4–10.5)
Chloride: 104 mEq/L (ref 96–112)
Creatinine, Ser: 0.77 mg/dL (ref 0.40–1.20)
GFR: 97.57 mL/min (ref >60.00)
Glucose, Bld: 83 mg/dL (ref 70–99)
Potassium: 4.7 mEq/L (ref 3.5–5.1)
Sodium: 136 mEq/L (ref 135–145)
Total Bilirubin: 0.9 mg/dL (ref 0.2–1.2)
Total Protein: 7 g/dL (ref 6.0–8.3)

## 2022-12-02 LAB — TSH: TSH: 1.42 u[IU]/mL (ref 0.35–5.50)

## 2022-12-02 LAB — CBC
HCT: 39.5 % (ref 36.0–46.0)
Hemoglobin: 12.8 g/dL (ref 12.0–15.0)
MCHC: 32.5 g/dL (ref 30.0–36.0)
MCV: 91 fl (ref 78.0–100.0)
Platelets: 174 10*3/uL (ref 150.0–400.0)
RBC: 4.34 Mil/uL (ref 3.87–5.11)
RDW: 12.9 % (ref 11.5–15.5)
WBC: 4.2 10*3/uL (ref 4.0–10.5)

## 2022-12-02 NOTE — Progress Notes (Signed)
   Subjective:   Patient ID: Kayla Trevino, female    DOB: May 14, 1986, 37 y.o.   MRN: [id number]  HPI The patient is here for physical.  PMH, [facility], social history reviewed and updated  Review of Systems  Constitutional: Negative.   HENT: Negative.    Eyes: Negative.   Respiratory:  Negative for cough, chest tightness and shortness of breath.   Cardiovascular:  Negative for chest pain, palpitations and leg swelling.  Gastrointestinal:  Negative for abdominal distention, abdominal pain, constipation, diarrhea, nausea and vomiting.  Musculoskeletal: Negative.   Skin: Negative.   Neurological: Negative.   Psychiatric/Behavioral: Negative.      Objective:  Physical Exam Constitutional:      Appearance: She is well-developed.  HENT:     Head: Normocephalic and atraumatic.  Cardiovascular:     Rate and Rhythm: Normal rate and regular rhythm.  Pulmonary:     Effort: Pulmonary effort is normal. No respiratory distress.     Breath sounds: Normal breath sounds. No wheezing or rales.  Abdominal:     General: Bowel sounds are normal. There is no distension.     Palpations: Abdomen is soft.     Tenderness: There is no abdominal tenderness. There is no rebound.  Musculoskeletal:     Cervical back: Normal range of motion.  Skin:    General: Skin is warm and dry.  Neurological:     Mental Status: She is alert and oriented to person, place, and time.     Coordination: Coordination normal.     Vitals:   12/02/22 1105  BP: 126/80  Pulse: (!) 52  Temp: 98.2 F (36.8 C)  TempSrc: Oral  SpO2: 99%  Weight: 181 lb (82.1 kg)  Height: 5\' 8"  (1.727 m)    Assessment & Plan:

## 2022-12-02 NOTE — Assessment & Plan Note (Signed)
Flu shot yearly. Tetanus up to date. Pap smear up to date likely getting records to confirm. Counseled about sun safety and mole surveillance. Counseled about the dangers of distracted driving. Given 10 year screening recommendations.

## 2023-02-26 DIAGNOSIS — Z01419 Encounter for gynecological examination (general) (routine) without abnormal findings: Secondary | ICD-10-CM | POA: Diagnosis not present

## 2023-02-26 DIAGNOSIS — Z124 Encounter for screening for malignant neoplasm of cervix: Secondary | ICD-10-CM | POA: Diagnosis not present

## 2023-02-26 DIAGNOSIS — Z01411 Encounter for gynecological examination (general) (routine) with abnormal findings: Secondary | ICD-10-CM | POA: Diagnosis not present

## 2023-02-26 DIAGNOSIS — Z1331 Encounter for screening for depression: Secondary | ICD-10-CM | POA: Diagnosis not present

## 2023-02-26 DIAGNOSIS — Z6828 Body mass index (BMI) 28.0-28.9, adult: Secondary | ICD-10-CM | POA: Diagnosis not present

## 2023-02-26 DIAGNOSIS — Z113 Encounter for screening for infections with a predominantly sexual mode of transmission: Secondary | ICD-10-CM | POA: Diagnosis not present

## 2023-03-04 LAB — HM PAP SMEAR: HPV, high-risk: NEGATIVE

## 2023-07-03 ENCOUNTER — Telehealth: Payer: Self-pay | Admitting: Internal Medicine

## 2023-07-03 NOTE — Telephone Encounter (Unsigned)
Copied from CRM [id number]. Topic: General - Other >> Jul 03, 2023 10:32 AM Corin V wrote: Reason for CRM: Patient is wanting to get a mammogram set up since she turns 38 this year. Please call her with any instructions on getting that set up.

## 2023-07-03 NOTE — Telephone Encounter (Signed)
Once she is 38 she can schedule herself on mychart for this

## 2023-07-03 NOTE — Telephone Encounter (Signed)
Please advise

## 2023-09-07 DIAGNOSIS — D225 Melanocytic nevi of trunk: Secondary | ICD-10-CM | POA: Diagnosis not present

## 2023-09-07 DIAGNOSIS — L814 Other melanin hyperpigmentation: Secondary | ICD-10-CM | POA: Diagnosis not present

## 2023-09-07 DIAGNOSIS — D492 Neoplasm of unspecified behavior of bone, soft tissue, and skin: Secondary | ICD-10-CM | POA: Diagnosis not present

## 2023-09-10 DIAGNOSIS — D2272 Melanocytic nevi of left lower limb, including hip: Secondary | ICD-10-CM | POA: Diagnosis not present

## 2023-09-10 DIAGNOSIS — D492 Neoplasm of unspecified behavior of bone, soft tissue, and skin: Secondary | ICD-10-CM | POA: Diagnosis not present

## 2023-12-03 ENCOUNTER — Encounter: Payer: 59 | Admitting: Internal Medicine

## 2023-12-04 ENCOUNTER — Encounter: Payer: Self-pay | Admitting: Internal Medicine

## 2023-12-04 ENCOUNTER — Ambulatory Visit (INDEPENDENT_AMBULATORY_CARE_PROVIDER_SITE_OTHER): Admitting: Internal Medicine

## 2023-12-04 VITALS — BP 98/68 | HR 46 | Temp 98.7°F | Ht 68.0 in | Wt 158.0 lb

## 2023-12-04 DIAGNOSIS — Z0000 Encounter for general adult medical examination without abnormal findings: Secondary | ICD-10-CM | POA: Diagnosis not present

## 2023-12-04 LAB — LIPID PANEL
Cholesterol: 131 mg/dL (ref 0–200)
HDL: 69.9 mg/dL (ref >39.00)
LDL Cholesterol: 51 mg/dL (ref 0–99)
NonHDL: 60.88
Total CHOL/HDL Ratio: 2
Triglycerides: 48 mg/dL (ref 0.0–149.0)
VLDL: 9.6 mg/dL (ref 0.0–40.0)

## 2023-12-04 LAB — COMPREHENSIVE METABOLIC PANEL WITH GFR
ALT: 14 U/L (ref 0–35)
AST: 19 U/L (ref 0–37)
Albumin: 4 g/dL (ref 3.5–5.2)
Alkaline Phosphatase: 44 U/L (ref 39–117)
BUN: 17 mg/dL (ref 6–23)
CO2: 27 meq/L (ref 19–32)
Calcium: 9 mg/dL (ref 8.4–10.5)
Chloride: 108 meq/L (ref 96–112)
Creatinine, Ser: 0.72 mg/dL (ref 0.40–1.20)
GFR: 105.01 mL/min (ref >60.00)
Glucose, Bld: 59 mg/dL — ABNORMAL LOW (ref 70–99)
Potassium: 4.3 meq/L (ref 3.5–5.1)
Sodium: 139 meq/L (ref 135–145)
Total Bilirubin: 1.1 mg/dL (ref 0.2–1.2)
Total Protein: 6.5 g/dL (ref 6.0–8.3)

## 2023-12-04 LAB — CBC
HCT: 36.5 % (ref 36.0–46.0)
Hemoglobin: 12.4 g/dL (ref 12.0–15.0)
MCHC: 34 g/dL (ref 30.0–36.0)
MCV: 91.7 fl (ref 78.0–100.0)
Platelets: 152 10*3/uL (ref 150.0–400.0)
RBC: 3.98 Mil/uL (ref 3.87–5.11)
RDW: 12.6 % (ref 11.5–15.5)
WBC: 3.6 10*3/uL — ABNORMAL LOW (ref 4.0–10.5)

## 2023-12-04 NOTE — Progress Notes (Signed)
   Subjective:   Patient ID: Kayla Trevino, female    DOB: 15-Nov-1985, 38 y.o.   MRN: [id number]  HPI The patient is here for physical.  PMH, [facility], social history reviewed and updated  Review of Systems  Constitutional: Negative.   HENT: Negative.    Eyes: Negative.   Respiratory:  Negative for cough, chest tightness and shortness of breath.   Cardiovascular:  Negative for chest pain, palpitations and leg swelling.  Gastrointestinal:  Negative for abdominal distention, abdominal pain, constipation, diarrhea, nausea and vomiting.  Musculoskeletal: Negative.   Skin: Negative.   Neurological: Negative.   Psychiatric/Behavioral: Negative.      Objective:  Physical Exam Constitutional:      Appearance: She is well-developed.  HENT:     Head: Normocephalic and atraumatic.   Cardiovascular:     Rate and Rhythm: Normal rate and regular rhythm.  Pulmonary:     Effort: Pulmonary effort is normal. No respiratory distress.     Breath sounds: Normal breath sounds. No wheezing or rales.  Abdominal:     General: Bowel sounds are normal. There is no distension.     Palpations: Abdomen is soft.     Tenderness: There is no abdominal tenderness. There is no rebound.   Musculoskeletal:     Cervical back: Normal range of motion.   Skin:    General: Skin is warm and dry.   Neurological:     Mental Status: She is alert and oriented to person, place, and time.     Coordination: Coordination normal.     Vitals:   12/04/23 0906  BP: 98/68  Pulse: (!) 46  Temp: 98.7 F (37.1 C)  TempSrc: Oral  SpO2: 99%  Weight: 158 lb (71.7 kg)  Height: 5' 8 (1.727 m)    Assessment & Plan:

## 2023-12-04 NOTE — Assessment & Plan Note (Signed)
 Flu shot yearly. Tetanus up to date. Mammogram due at 38, pap smear up to date. Counseled about sun safety and mole surveillance. Counseled about the dangers of distracted driving. Given 10 year screening recommendations.

## 2023-12-07 ENCOUNTER — Ambulatory Visit: Payer: Self-pay | Admitting: Internal Medicine

## 2024-01-22 ENCOUNTER — Telehealth: Admitting: Physician Assistant

## 2024-01-22 ENCOUNTER — Other Ambulatory Visit (HOSPITAL_COMMUNITY): Payer: Self-pay

## 2024-01-22 DIAGNOSIS — L255 Unspecified contact dermatitis due to plants, except food: Secondary | ICD-10-CM | POA: Diagnosis not present

## 2024-01-22 MED ORDER — PREDNISONE 10 MG (21) PO TBPK
ORAL_TABLET | ORAL | 0 refills | Status: AC
  Filled 2024-01-22: qty 21, 6d supply, fill #0

## 2024-01-22 NOTE — Progress Notes (Signed)
 E-Visit for [employer]  We are sorry that you are not feeing well.  Here is how we plan to help!  Based on what you have shared with me it looks like you have had an allergic reaction to the oily resin from a group of plants.  This resin is very sticky, so it easily attaches to your skin, clothing, tools equipment, and pet's fur.    This blistering rash is often called poison ivy rash although it can come from contact with the leaves, stems and roots of poison ivy, poison oak and poison sumac.  The oily resin contains urushiol (u-ROO-she-ol) that produces a skin rash on exposed skin.  The severity of the rash depends on the amount of urushiol that gets on your skin.  A section of skin with more urushiol on it may develop a rash sooner.  The rash usually develops 12-48 hours after exposure and can last two to three weeks.  Your skin must come in direct contact with the plant's oil to be affected.  Blister fluid doesn't spread the rash.  However, if you come into contact with a piece of clothing or pet fur that has urushiol on it, the rash may spread out.  You can also transfer the oil to other parts of your body with your fingers.  Often the rash looks like a straight line because of the way the plant brushes against your skin.  Since your rash is widespread or has resulted in a large number of blisters, I have prescribed an oral corticosteroid.  Please follow these recommendations:  I have sent a prednisone dose pack to your chosen pharmacy. Be sure to follow the instructions carefully and complete the entire prescription. You may use Benadryl or Caladryl topical lotions to sooth the itch and remember cool, not hot, showers and baths can help relieve the itching!  Place cool, wet compresses on the affected area for 15-30 minutes several times a day.  You may also take oral antihistamines, such as diphenhydramine (Benadryl, others), which may also help you sleep better.  Watch your skin for any purulent  (pus) drainage or red streaking from the site.  If this occurs, contact your provider.  You may require an antibiotic for a skin infection.  Make sure that the clothes you were wearing as well as any towels or sheets that may have come in contact with the oil (urushiol) are washed in detergent and hot water.       I have developed the following plan to treat your condition: I am prescribing a 6 day prednisone taper.  Day 1: Take 2 tablets with breakfast, 1 tablet with lunch, 1 tablet with supper, and 2 tablets at bedtime Day 2: Take 2 tablets with breakfast, 1 tablet with lunch, 1 tablet with supper, and 1 tablet at bedtime Day 3: Take 1 tablet at breakfast, 1 tablet with lunch, 1 tablet with supper, and 1 tablet at bedtime Day 4: Take 1 tablet with breakfast, 1 tablet with supper and 1 tablet at bedtime Day 5: Take 1 tablet with breakfast and 1 tablet with supper or bedtime Day 6: Take 1 tablet with breakfast    What can you do to prevent this rash?  Avoid the plants.  Learn how to identify poison ivy, poison oak and poison sumac in all seasons.  When hiking or engaging in other activities that might expose you to these plants, try to stay on cleared pathways.  If camping, make sure you  pitch your tent in an area free of these plants.  Keep pets from running through wooded areas so that urushiol doesn't accidentally stick to their fur, which you may touch.  Remove or kill the plants.  In your yard, you can get rid of poison ivy by applying an herbicide or pulling it out of the ground, including the roots, while wearing heavy gloves.  Afterward remove the gloves and thoroughly wash them and your hands.  Don't burn poison ivy or related plants because the urushiol can be carried by smoke.  Wear protective clothing.  If needed, protect your skin by wearing socks, boots, pants, long sleeves and vinyl gloves.  Wash your skin right away.  Washing off the oil with soap and water within 30 minutes of  exposure may reduce your chances of getting a poison ivy rash.  Even washing after an hour or so can help reduce the severity of the rash.  If you walk through some poison ivy and then later touch your shoes, you may get some urushiol on your hands, which may then transfer to your face or body by touching or rubbing.  If the contaminated object isn't cleaned, the urushiol on it can still cause a skin reaction years later.    Be careful not to reuse towels after you have washed your skin.  Also carefully wash clothing in detergent and hot water to remove all traces of the oil.  Handle contaminated clothing carefully so you don't transfer the urushiol to yourself, furniture, rugs or appliances.  Remember that pets can carry the oil on their fur and paws.  If you think your pet may be contaminated with urushiol, put on some long rubber gloves and give your pet a bath.  Finally, be careful not to burn these plants as the smoke can contain traces of the oil.  Inhaling the smoke may result in difficulty breathing. If that occurred you should see a physician as soon as possible.  See your doctor right away if:  The reaction is severe or widespread You inhaled the smoke from burning poison ivy and are having difficulty breathing Your skin continues to swell The rash affects your eyes, mouth or genitals Blisters are oozing pus You develop a fever greater than 100 F (37.8 C) The rash doesn't get better within a few weeks.  If you scratch the poison ivy rash, bacteria under your fingernails may cause the skin to become infected.  See your doctor if pus starts oozing from the blisters.  Treatment generally includes antibiotics.  Poison ivy treatments are usually limited to self-care methods.  And the rash typically goes away on its own in two to three weeks.     If the rash is widespread or results in a large number of blisters, your doctor may prescribe an oral corticosteroid, such as prednisone.  If  a bacterial infection has developed at the rash site, your doctor may give you a prescription for an oral antibiotic.  MAKE SURE YOU  Understand these instructions. Will watch your condition. Will get help right away if you are not doing well or get worse.   Thank you for choosing an e-visit.  Your e-visit answers were reviewed by a board certified advanced clinical practitioner to complete your personal care plan. Depending upon the condition, your plan could have included both over the counter or prescription medications.  Please review your pharmacy choice. Make sure the pharmacy is open so you can pick up prescription now.  If there is a problem, you may contact your provider through [employer] and have the prescription routed to another pharmacy.  Your safety is important to us . If you have drug allergies check your prescription carefully.   For the next 24 hours you can use MyChart to ask questions about today's visit, request a non-urgent call back, or ask for a work or school excuse. You will get an email in the next two days asking about your experience. I hope that your e-visit has been valuable and will speed your recovery.     I have spent 5 minutes in review of e-visit questionnaire, review and updating patient chart, medical decision making and response to patient.   Delon CHRISTELLA Dickinson, PA-C

## 2024-02-01 ENCOUNTER — Telehealth: Payer: Self-pay | Admitting: Internal Medicine

## 2024-02-01 ENCOUNTER — Other Ambulatory Visit (HOSPITAL_COMMUNITY): Payer: Self-pay

## 2024-02-01 MED ORDER — PREDNISONE 20 MG PO TABS
20.0000 mg | ORAL_TABLET | Freq: Every day | ORAL | 0 refills | Status: AC
  Filled 2024-02-01: qty 14, 14d supply, fill #0

## 2024-02-01 NOTE — Telephone Encounter (Signed)
 02/01/2024 Ongoing poison ivy dermatitis due to recent vacation, sweating.  Advised to change out clothes/shoes etc that could be exacerbating.  Rx pred 14x 20mg  pills that she will taper.  F/u if not improving  Rolan Sharps MD PCCM

## 2024-02-17 ENCOUNTER — Other Ambulatory Visit (HOSPITAL_COMMUNITY): Payer: Self-pay

## 2024-02-17 DIAGNOSIS — N898 Other specified noninflammatory disorders of vagina: Secondary | ICD-10-CM | POA: Diagnosis not present

## 2024-02-17 DIAGNOSIS — N760 Acute vaginitis: Secondary | ICD-10-CM | POA: Diagnosis not present

## 2024-02-17 MED ORDER — TINIDAZOLE 500 MG PO TABS
1000.0000 mg | ORAL_TABLET | Freq: Every day | ORAL | 0 refills | Status: AC
  Filled 2024-02-17: qty 10, 5d supply, fill #0

## 2024-02-23 ENCOUNTER — Other Ambulatory Visit (HOSPITAL_COMMUNITY): Payer: Self-pay

## 2024-02-23 MED ORDER — TINIDAZOLE 500 MG PO TABS
1000.0000 mg | ORAL_TABLET | Freq: Every day | ORAL | 0 refills | Status: AC
  Filled 2024-02-23: qty 10, 5d supply, fill #0

## 2024-04-05 DIAGNOSIS — Z1331 Encounter for screening for depression: Secondary | ICD-10-CM | POA: Diagnosis not present

## 2024-04-05 DIAGNOSIS — Z01419 Encounter for gynecological examination (general) (routine) without abnormal findings: Secondary | ICD-10-CM | POA: Diagnosis not present

## 2024-04-05 DIAGNOSIS — Z1231 Encounter for screening mammogram for malignant neoplasm of breast: Secondary | ICD-10-CM | POA: Diagnosis not present

## 2024-04-05 LAB — HM MAMMOGRAPHY

## 2024-04-28 DIAGNOSIS — Z3202 Encounter for pregnancy test, result negative: Secondary | ICD-10-CM | POA: Diagnosis not present

## 2024-04-28 DIAGNOSIS — Z30430 Encounter for insertion of intrauterine contraceptive device: Secondary | ICD-10-CM | POA: Diagnosis not present

## 2024-12-05 ENCOUNTER — Encounter: Admitting: Internal Medicine
